# Patient Record
Sex: Female | Born: 1935 | Race: White | Hispanic: No | Marital: Married | Smoking: Never smoker
Health system: Southern US, Community
[De-identification: ages and names within clinical notes are randomized; demographics above are authoritative.]

## PROBLEM LIST (undated history)

## (undated) DIAGNOSIS — F419 Anxiety disorder, unspecified: Secondary | ICD-10-CM

## (undated) DIAGNOSIS — IMO0001 Reserved for inherently not codable concepts without codable children: Secondary | ICD-10-CM

## (undated) DIAGNOSIS — G43909 Migraine, unspecified, not intractable, without status migrainosus: Secondary | ICD-10-CM

## (undated) DIAGNOSIS — E785 Hyperlipidemia, unspecified: Secondary | ICD-10-CM

## (undated) HISTORY — DX: Hyperlipidemia, unspecified: E78.5

## (undated) HISTORY — DX: Reserved for inherently not codable concepts without codable children: IMO0001

## (undated) HISTORY — DX: Anxiety disorder, unspecified: F41.9

## (undated) HISTORY — PX: ABDOMINAL HYSTERECTOMY: SHX81

## (undated) HISTORY — DX: Migraine, unspecified, not intractable, without status migrainosus: G43.909

## (undated) HISTORY — PX: OOPHORECTOMY: SHX86

---

## 2002-08-23 HISTORY — PX: FLEXIBLE SIGMOIDOSCOPY: SHX1649

## 2003-05-24 ENCOUNTER — Other Ambulatory Visit: Admission: RE | Admit: 2003-05-24 | Discharge: 2003-05-24 | Payer: Self-pay | Admitting: Obstetrics and Gynecology

## 2005-05-25 ENCOUNTER — Encounter: Payer: Self-pay | Admitting: Family Medicine

## 2005-05-25 LAB — CONVERTED CEMR LAB

## 2005-06-20 ENCOUNTER — Other Ambulatory Visit: Admission: RE | Admit: 2005-06-20 | Discharge: 2005-06-20 | Payer: Self-pay | Admitting: Obstetrics and Gynecology

## 2006-05-01 ENCOUNTER — Ambulatory Visit: Payer: Self-pay | Admitting: Family Medicine

## 2006-05-17 DIAGNOSIS — F411 Generalized anxiety disorder: Secondary | ICD-10-CM | POA: Insufficient documentation

## 2006-05-17 DIAGNOSIS — M810 Age-related osteoporosis without current pathological fracture: Secondary | ICD-10-CM | POA: Insufficient documentation

## 2006-06-06 ENCOUNTER — Encounter: Payer: Self-pay | Admitting: Family Medicine

## 2006-07-04 ENCOUNTER — Ambulatory Visit: Payer: Self-pay | Admitting: Family Medicine

## 2006-07-04 DIAGNOSIS — R030 Elevated blood-pressure reading, without diagnosis of hypertension: Secondary | ICD-10-CM

## 2006-09-16 ENCOUNTER — Encounter: Payer: Self-pay | Admitting: Family Medicine

## 2006-09-16 LAB — CONVERTED CEMR LAB
Cholesterol: 271 mg/dL — ABNORMAL HIGH (ref 0–200)
Hgb A1c MFr Bld: 5.8 % (ref 4.6–6.1)
TSH: 3.889 microintl units/mL (ref 0.350–5.50)

## 2006-09-18 ENCOUNTER — Encounter: Payer: Self-pay | Admitting: Family Medicine

## 2006-09-26 ENCOUNTER — Ambulatory Visit: Payer: Self-pay | Admitting: Family Medicine

## 2006-12-25 ENCOUNTER — Ambulatory Visit: Payer: Self-pay | Admitting: Family Medicine

## 2007-03-27 ENCOUNTER — Ambulatory Visit: Payer: Self-pay | Admitting: Family Medicine

## 2007-03-27 DIAGNOSIS — E785 Hyperlipidemia, unspecified: Secondary | ICD-10-CM | POA: Insufficient documentation

## 2007-03-27 LAB — CONVERTED CEMR LAB
Basophils Absolute: 0 10*3/uL (ref 0.0–0.1)
Cholesterol: 295 mg/dL — ABNORMAL HIGH (ref 0–200)
Eosinophils Relative: 3 % (ref 0–5)
HCT: 42.2 % (ref 36.0–46.0)
HDL: 114 mg/dL (ref >39)
Hemoglobin: 14 g/dL (ref 12.0–15.0)
Lymphocytes Relative: 33 % (ref 12–46)
Lymphs Abs: 2.5 10*3/uL (ref 0.7–3.3)
Monocytes Absolute: 0.9 10*3/uL — ABNORMAL HIGH (ref 0.2–0.7)
Neutro Abs: 4 10*3/uL (ref 1.7–7.7)
Platelets: 375 10*3/uL (ref 150–400)
RDW: 13.4 % (ref 11.5–14.0)
Total CHOL/HDL Ratio: 2.6
VLDL: 22 mg/dL (ref 0–40)
WBC: 7.6 10*3/uL (ref 4.0–10.5)

## 2007-03-28 ENCOUNTER — Encounter: Payer: Self-pay | Admitting: Family Medicine

## 2007-05-05 ENCOUNTER — Telehealth: Payer: Self-pay | Admitting: Family Medicine

## 2007-05-26 ENCOUNTER — Ambulatory Visit: Payer: Self-pay | Admitting: Family Medicine

## 2007-05-26 ENCOUNTER — Telehealth: Payer: Self-pay | Admitting: Family Medicine

## 2007-06-18 ENCOUNTER — Ambulatory Visit: Payer: Self-pay | Admitting: Family Medicine

## 2007-08-13 ENCOUNTER — Encounter: Payer: Self-pay | Admitting: Family Medicine

## 2007-09-02 ENCOUNTER — Encounter: Payer: Self-pay | Admitting: Family Medicine

## 2007-09-18 ENCOUNTER — Telehealth: Payer: Self-pay | Admitting: Family Medicine

## 2007-09-19 ENCOUNTER — Encounter: Payer: Self-pay | Admitting: Family Medicine

## 2007-09-23 ENCOUNTER — Ambulatory Visit: Payer: Self-pay | Admitting: Family Medicine

## 2007-09-23 LAB — CONVERTED CEMR LAB
AST: 17 units/L (ref 0–37)
Alkaline Phosphatase: 65 units/L (ref 39–117)
BUN: 17 mg/dL (ref 6–23)
Glucose, Bld: 92 mg/dL (ref 70–99)
Potassium: 5.4 meq/L — ABNORMAL HIGH (ref 3.5–5.3)
Total Bilirubin: 0.7 mg/dL (ref 0.3–1.2)
Vit D, 1,25-Dihydroxy: 26 — ABNORMAL LOW (ref 30–89)

## 2007-09-30 ENCOUNTER — Encounter: Payer: Self-pay | Admitting: Family Medicine

## 2007-12-02 ENCOUNTER — Telehealth: Payer: Self-pay | Admitting: Family Medicine

## 2007-12-02 DIAGNOSIS — E875 Hyperkalemia: Secondary | ICD-10-CM | POA: Insufficient documentation

## 2007-12-03 ENCOUNTER — Encounter: Payer: Self-pay | Admitting: Family Medicine

## 2007-12-05 LAB — CONVERTED CEMR LAB
Potassium: 4.8 meq/L (ref 3.5–5.3)
Vit D, 1,25-Dihydroxy: 46 (ref 30–89)

## 2008-03-23 ENCOUNTER — Telehealth: Payer: Self-pay | Admitting: Family Medicine

## 2008-03-23 LAB — HM DEXA SCAN

## 2008-03-30 ENCOUNTER — Encounter: Admission: RE | Admit: 2008-03-30 | Discharge: 2008-03-30 | Payer: Self-pay | Admitting: Family Medicine

## 2008-03-30 ENCOUNTER — Encounter: Payer: Self-pay | Admitting: Family Medicine

## 2008-03-31 LAB — CONVERTED CEMR LAB
Cholesterol: 281 mg/dL — ABNORMAL HIGH (ref 0–200)
HDL: 103 mg/dL (ref >39)
Triglycerides: 165 mg/dL — ABNORMAL HIGH (ref <150)

## 2008-04-20 ENCOUNTER — Ambulatory Visit: Payer: Self-pay | Admitting: Family Medicine

## 2008-04-22 ENCOUNTER — Encounter: Payer: Self-pay | Admitting: Family Medicine

## 2008-04-23 LAB — CONVERTED CEMR LAB
BUN: 21 mg/dL (ref 6–23)
CO2: 21 meq/L (ref 19–32)
Chloride: 101 meq/L (ref 96–112)
Creatinine, Ser: 0.94 mg/dL (ref 0.40–1.20)
Glucose, Bld: 101 mg/dL — ABNORMAL HIGH (ref 70–99)
Potassium: 5.1 meq/L (ref 3.5–5.3)

## 2008-04-30 ENCOUNTER — Ambulatory Visit: Payer: Self-pay | Admitting: Family Medicine

## 2008-04-30 LAB — CONVERTED CEMR LAB: OCCULT 2: NEGATIVE

## 2008-05-31 ENCOUNTER — Telehealth: Payer: Self-pay | Admitting: Family Medicine

## 2008-08-20 ENCOUNTER — Encounter: Payer: Self-pay | Admitting: Family Medicine

## 2008-09-16 ENCOUNTER — Ambulatory Visit: Payer: Self-pay | Admitting: Family Medicine

## 2008-12-01 ENCOUNTER — Telehealth (INDEPENDENT_AMBULATORY_CARE_PROVIDER_SITE_OTHER): Payer: Self-pay | Admitting: *Deleted

## 2009-03-29 ENCOUNTER — Telehealth: Payer: Self-pay | Admitting: Family Medicine

## 2009-04-01 ENCOUNTER — Encounter: Payer: Self-pay | Admitting: Family Medicine

## 2009-04-04 LAB — CONVERTED CEMR LAB
AST: 21 units/L (ref 0–37)
Albumin: 4.5 g/dL (ref 3.5–5.2)
BUN: 18 mg/dL (ref 6–23)
CO2: 23 meq/L (ref 19–32)
Calcium: 9.5 mg/dL (ref 8.4–10.5)
Chloride: 102 meq/L (ref 96–112)
Cholesterol: 303 mg/dL — ABNORMAL HIGH (ref 0–200)
Glucose, Bld: 96 mg/dL (ref 70–99)
HDL: 107 mg/dL (ref >39)
Potassium: 5.5 meq/L — ABNORMAL HIGH (ref 3.5–5.3)
Vit D, 25-Hydroxy: 44 ng/mL (ref 30–89)

## 2009-04-18 ENCOUNTER — Ambulatory Visit: Payer: Self-pay | Admitting: Family Medicine

## 2009-04-20 LAB — CONVERTED CEMR LAB: Potassium: 4.3 meq/L (ref 3.5–5.3)

## 2009-08-27 ENCOUNTER — Encounter: Payer: Self-pay | Admitting: Family Medicine

## 2009-10-11 ENCOUNTER — Telehealth: Payer: Self-pay | Admitting: Family Medicine

## 2009-10-11 DIAGNOSIS — R5381 Other malaise: Secondary | ICD-10-CM

## 2009-10-11 DIAGNOSIS — R5383 Other fatigue: Secondary | ICD-10-CM

## 2009-10-12 ENCOUNTER — Encounter: Payer: Self-pay | Admitting: Family Medicine

## 2009-10-13 LAB — CONVERTED CEMR LAB
Hemoglobin: 13.5 g/dL (ref 12.0–15.0)
Lymphs Abs: 2.8 10*3/uL (ref 0.7–4.0)
MCV: 93.5 fL (ref 78.0–100.0)
Monocytes Relative: 10 % (ref 3–12)
Neutro Abs: 4.1 10*3/uL (ref 1.7–7.7)
Neutrophils Relative %: 51 % (ref 43–77)
Potassium: 5.4 meq/L — ABNORMAL HIGH (ref 3.5–5.3)
RBC: 4.34 M/uL (ref 3.87–5.11)
Vit D, 25-Hydroxy: 47 ng/mL (ref 30–89)
Vitamin B-12: 746 pg/mL (ref 211–911)
WBC: 8.1 10*3/uL (ref 4.0–10.5)

## 2009-10-17 ENCOUNTER — Ambulatory Visit: Payer: Self-pay | Admitting: Family Medicine

## 2010-02-01 ENCOUNTER — Encounter: Payer: Self-pay | Admitting: Family Medicine

## 2010-02-02 LAB — CONVERTED CEMR LAB
BUN: 20 mg/dL (ref 6–23)
Calcium: 9.8 mg/dL (ref 8.4–10.5)
Creatinine, Ser: 0.9 mg/dL (ref 0.40–1.20)
Glucose, Bld: 88 mg/dL (ref 70–99)

## 2010-04-19 ENCOUNTER — Telehealth: Payer: Self-pay | Admitting: Family Medicine

## 2010-04-21 ENCOUNTER — Encounter: Payer: Self-pay | Admitting: Family Medicine

## 2010-04-21 LAB — CONVERTED CEMR LAB
ALT: 16 units/L (ref 0–35)
Alkaline Phosphatase: 63 units/L (ref 39–117)
LDL Cholesterol: 164 mg/dL — ABNORMAL HIGH (ref 0–99)
Sodium: 140 meq/L (ref 135–145)
Total Bilirubin: 0.8 mg/dL (ref 0.3–1.2)
Total Protein: 6.8 g/dL (ref 6.0–8.3)
Triglycerides: 107 mg/dL (ref <150)
VLDL: 21 mg/dL (ref 0–40)

## 2010-04-25 ENCOUNTER — Ambulatory Visit: Payer: Self-pay | Admitting: Family Medicine

## 2010-05-10 ENCOUNTER — Encounter: Admission: RE | Admit: 2010-05-10 | Discharge: 2010-05-10 | Payer: Self-pay | Admitting: Family Medicine

## 2010-06-20 ENCOUNTER — Ambulatory Visit: Payer: Self-pay | Admitting: Obstetrics & Gynecology

## 2010-08-22 NOTE — Miscellaneous (Signed)
Summary: mammogram normal  Clinical Lists Changes  Observations: Added new observation of MAMMO DUE: 08/25/2010 (08/27/2009 12:26) Added new observation of FLUVAXDUE: 04/18/2010 (08/27/2009 12:26) Added new observation of TDBOOSTDUE: 02/21/2012 (08/27/2009 12:26) Added new observation of PNEUMVXNEXT: None (08/27/2009 12:26) Added new observation of HDLNXTDUE: 04/01/2014 (08/27/2009 12:26) Added new observation of LDLNXTDUE: 04/01/2014 (08/27/2009 12:26) Added new observation of PAP DUE: 05/25/2006 (08/27/2009 12:26) Added new observation of HGBA1CNXTDUE: 12/15/2006 (08/27/2009 12:26) Added new observation of CREATNXTDUE: 04/01/2010 (08/27/2009 12:26) Added new observation of POTASSIUMDUE: 04/18/2010 (08/27/2009 12:26) Added new observation of LAST MAM DAT: 08/25/2009 (08/25/2009 12:27) Added new observation of MAMMOGRAM: normal (08/25/2009 12:27)     Last Mammogram:  Done at [facility]  No specific mammographic evidence of malignancy.  No significant changes compared to previous study.  Assessment: BIRADS 1.  (08/19/2008 2:08:45 PM) Mammogram Result Date:  08/25/2009 Mammogram Result:  normal Mammogram Next Due:  1 yr

## 2010-08-22 NOTE — Assessment & Plan Note (Signed)
Summary: f/u anxiety   Vital Signs:  Patient profile:   75 year old female Height:      63.25 inches Weight:      146 pounds BMI:     25.75 O2 Sat:      98 % on Room air Pulse rate:   65 / minute BP sitting:   189 / 87  (left arm) Cuff size:   regular  Vitals Entered By: Payton Spark CMA (October 17, 2009 8:23 AM)  O2 Flow:  Room air CC: F/U labs   Primary Care Provider:  Seymour Bars D.O.  CC:  F/U labs.  History of Present Illness: 75 yo WF presents for f/u visit.  She has white coat HTN.  Her home BP readings are all <140/90.  She is under a lot of stress at work.  Her labs just showed a mildly high K+ at 5.4.  She denies using a salt substitute.  She has had normal kidney function.  Her B12 and Vitamin D came back normal.  Her blood counts are normal.    Her TSH was elevated at 5.1.  Added on a free T3 and Free T4 today.      Current Medications (verified): 1)  Fish Oil 1000 Mg Caps (Omega-3 Fatty Acids) .Marland Kitchen.. 1 Capusule Tid 2)  Ativan 1 Mg Tabs (Lorazepam) .... 1/2 Tab By Mouth Two Times A Day As Needed Anxiety 3)  Caltrate 600 1500 Mg Tabs (Calcium Carbonate) .Marland Kitchen.. 1 Tab By Mouth Two Times A Day Ac 4)  Multivitamins  Caps (Multiple Vitamin) .Marland Kitchen.. 1 Tab By Mouth Daily 5)  Vitamin D 50,000 Iu .Marland Kitchen.. 1 Capsule By Mouth Q Week  Allergies (verified): 1)  ! Sulfa 2)  ! Codeine 3)  ! Epinephrine 4)  ! Prednisone  Past History:  Past Medical History: Reviewed history from 04/18/2009 and no changes required. Hemorrhoids Migraines Anxiety Osteoporosis White Coat HTN dyslipidemia  Past Surgical History: Reviewed history from 09/16/2008 and no changes required. Hysterectomy Oophorectomy flex sig- 2/04 negative DXA T score -3.0 9-09  Family History: Reviewed history from 05/17/2006 and no changes required. father died of COPD at 53 mother died of CHF, DM at 48 4 brothers, 1 sister, 1 daughter healthy 1 brother CVA at 85 1 brother- CLL leukemia  Social  History: Reviewed history from 09/16/2008 and no changes required. Married x 6 yrs.  Retired from [employer]	[place], but still working.  Nonsmoker.  Exercises regularly.  works at [employer].  Review of Systems      See HPI  Physical Exam  General:  alert, well-developed, well-nourished, and well-hydrated.   Head:  normocephalic and atraumatic.   Eyes:  pupils equal, pupils round, and pupils reactive to light.   Mouth:  pharynx pink and moist.   Neck:  no masses.   Lungs:  Normal respiratory effort, chest expands symmetrically. Lungs are clear to auscultation, no crackles or wheezes. Heart:  Normal rate and regular rhythm. S1 and S2 normal without gallop, murmur, click, rub or other extra sounds. Extremities:  no LE edema Skin:  color normal.   Cervical Nodes:  No lymphadenopathy noted Psych:  good eye contact and slightly anxious.     Impression & Recommendations:  Problem # 1:  ANXIETY (ICD-300.00) Stable with as needed use of Ativan.  We discussed her work stressors.  She is thinking about retirement and then starting to volunteer which would make her happier.  Continue medication and regular exercise which also  helps.   Her updated medication list for this problem includes:    Ativan 1 Mg Tabs (Lorazepam) .Marland Kitchen... 1/2 tab by mouth two times a day as needed anxiety  Problem # 2:  HYPERTENSION, WHITE COAT (ICD-796.2) Home BP readings look great.  We checked her home bP cuff last year against our and it was accurate.    Problem # 3:  HYPERKALEMIA (ICD-276.7) K+ 5.4 just mildly high w/o use of a salt substitute  and no hemolysis. Recheck in 4 mos.  Likely has RTA -- if increasing, will get an EKG and nephrology eval. Orders: T-Basic Metabolic Panel [id number])  Problem # 4:  DYSLIPIDEMIA (ICD-272.4) Not on meds.  HDL great and CRP was low in the Fall.  REcheck at CPE this fall. Labs Reviewed: SGOT: 21 (04/01/2009)   SGPT: 21 (04/01/2009)   HDL:107 (04/01/2009), 103  (03/30/2008)  LDL:166 (04/01/2009), 145 (03/30/2008)  Chol:303 (04/01/2009), 281 (03/30/2008)  Trig:150 (04/01/2009), 165 (03/30/2008)  Complete Medication List: 1)  Fish Oil 1000 Mg Caps (Omega-3 fatty acids) .Marland Kitchen.. 1 capusule tid 2)  Ativan 1 Mg Tabs (Lorazepam) .... 1/2 tab by mouth two times a day as needed anxiety 3)  Caltrate 600 1500 Mg Tabs (Calcium carbonate) .Marland Kitchen.. 1 tab by mouth two times a day ac 4)  Multivitamins Caps (Multiple vitamin) .Marland Kitchen.. 1 tab by mouth daily 5)  Vitamin D 50,000 Iu  .Marland Kitchen.. 1 capsule by mouth q week  Patient Instructions: 1)  Stay on current meds. 2)  REcheck K+ in 4 mos.

## 2010-08-22 NOTE — Assessment & Plan Note (Signed)
Summary: Medicare PHYS   Vital Signs:  Patient profile:   75 year old female Height:      63.25 inches Weight:      144 pounds BMI:     25.40 O2 Sat:      98 % on Room air Pulse rate:   65 / minute BP sitting:   178 / 85  (left arm) Cuff size:   regular  Vitals Entered By: Payton Spark CMA (April 25, 2010 8:15 AM)  O2 Flow:  Room air CC: Medicare Wellness Exam   Primary Care Provider:  Seymour Bars D.O.  CC:  Medicare Wellness Exam.  History of Present Illness: 75 yo WF presents for Medicare AWV.  See scanned in form for details.    Current Medications (verified): 1)  Fish Oil 1000 Mg Caps (Omega-3 Fatty Acids) .Marland Kitchen.. 1 Capusule Tid 2)  Ativan 1 Mg Tabs (Lorazepam) .... 1/2 Tab By Mouth Two Times A Day As Needed Anxiety 3)  Caltrate 600 1500 Mg Tabs (Calcium Carbonate) .Marland Kitchen.. 1 Tab By Mouth Two Times A Day Ac 4)  Multivitamins  Caps (Multiple Vitamin) .Marland Kitchen.. 1 Tab By Mouth Daily 5)  Vitamin D 50,000 Iu .Marland Kitchen.. 1 Capsule By Mouth Q Week  Allergies (verified): 1)  ! Sulfa 2)  ! Codeine 3)  ! Epinephrine 4)  ! Prednisone  Past History:  Past Medical History: Reviewed history from 04/18/2009 and no changes required. Hemorrhoids Migraines Anxiety Osteoporosis White Coat HTN dyslipidemia  Past Surgical History: Reviewed history from 09/16/2008 and no changes required. Hysterectomy Oophorectomy flex sig- 2/04 negative DXA T score -3.0 9-09  Family History: Reviewed history from 05/17/2006 and no changes required. father died of COPD at 87 mother died of CHF, DM at 40 4 brothers, 1 sister, 1 daughter healthy 1 brother CVA at 61 1 brother- CLL leukemia  Social History: Reviewed history from 09/16/2008 and no changes required. Married x 6 yrs.  Retired from [employer]	[place], but still working.  Nonsmoker.  Exercises regularly.  works at [employer].  Review of Systems  The patient denies anorexia, fever, weight loss, weight gain, vision loss,  decreased hearing, hoarseness, chest pain, syncope, dyspnea on exertion, peripheral edema, prolonged cough, headaches, hemoptysis, abdominal pain, melena, hematochezia, severe indigestion/heartburn, hematuria, incontinence, genital sores, muscle weakness, suspicious skin lesions, transient blindness, difficulty walking, depression, unusual weight change, abnormal bleeding, enlarged lymph nodes, angioedema, breast masses, and testicular masses.    Physical Exam  General:  alert, well-developed, well-nourished, and well-hydrated.   Head:  normocephalic and atraumatic.   Eyes:  pupils equal, pupils round, and pupils reactive to light.   Ears:  EACs patent; TMs translucent and gray with good cone of light and bony landmarks.  Nose:  no nasal discharge.   Mouth:  pharynx pink and moist and fair dentition.   Neck:  no masses.  no aubidle carotid bruits Lungs:  Normal respiratory effort, chest expands symmetrically. Lungs are clear to auscultation, no crackles or wheezes. Heart:  Normal rate and regular rhythm. S1 and S2 normal without gallop, murmur, click, rub or other extra sounds. Abdomen:  Bowel sounds positive,abdomen soft and non-tender without masses, organomegaly or hernias noted. Pulses:  2+ radial and pedal pulses Extremities:  no LE edema Skin:  color normal and no suspicious lesions.   Cervical Nodes:  No lymphadenopathy noted Psych:  good eye contact, not anxious appearing, and not depressed appearing.     Impression & Recommendations:  Problem # 1:  HEALTH MAINTENANCE EXAM (ICD-V70.0) Personalized Plan for Preventive Services Reviewed with pt and copy given to her today. Will update Flu shot andZostavax today.  Labs are UTD.  Colonoscopy and mammogram are UTD. Eye exam is UTD.  EKG done today - NSR w/o any sign of arrythmia or ischemia. DEXA due -- ordered.  BP at home is at goal, high here. BMI at goal.  Does not reqire pap/ pelvic unless having vag bleeding, discarge or skin  lesions. RTC for f/u in 6 mos. Orders: [facility] -Subsequent Annual Wellness Visit [phone])  Complete Medication List: 1)  Fish Oil 1000 Mg Caps (Omega-3 fatty acids) .Marland Kitchen.. 1 capusule tid 2)  Ativan 1 Mg Tabs (Lorazepam) .... 1/2 tab by mouth two times a day as needed anxiety 3)  Caltrate 600 1500 Mg Tabs (Calcium carbonate) .Marland Kitchen.. 1 tab by mouth two times a day ac 4)  Multivitamins Caps (Multiple vitamin) .Marland Kitchen.. 1 tab by mouth daily 5)  Vitamin D 50,000 Iu  .Marland Kitchen.. 1 capsule by mouth q week  Other Orders: T-DXA Bone Density/ Appendicular (60454) T-Dual DXA Bone Density/ Axial (09811) Flu Vaccine 54yrs + MEDICARE PATIENTS (B1478) Administration Flu vaccine - MCR (G0008) Zoster (Shingles) Vaccine Live (29562) Admin 1st Vaccine (13086) Admin 1st Vaccine (State) (57846N) EKG w/ Interpretation (93000) Flu Vaccine Consent Questions     Do you have a history of severe allergic reactions to this vaccine? no    Any prior history of allergic reactions to egg and/or gelatin? no    Do you have a sensitivity to the preservative Thimersol? no    Do you have a past history of Guillan-Barre Syndrome? no    Do you currently have an acute febrile illness? no    Have you ever had a severe reaction to latex? no    Vaccine information given and explained to patient? yes    Are you currently pregnant? no    Lot Number:[id number]   Exp Date:01/20/2011   Site Given  Left Deltoid IM-CCC]   .lbmedflu   Zostavax # 1    Vaccine Type: Zostavax    Site: left deltoid    Dose: 0.5 ml    Route: IM    Given by: Payton Spark CMA    Exp. Date: 02/24/2011    Lot #: [id number]    VIS given: 05/04/05 given April 25, 2010.  Appended Document: Medicare PHYS     Vitals Entered By: Payton Spark CMA (May 09, 2010 8:18 AM)  Allergies: 1)  ! Sulfa 2)  ! Codeine 3)  ! Epinephrine 4)  ! Prednisone   Complete Medication List: 1)  Fish Oil 1000 Mg Caps (Omega-3 fatty acids) .Marland Kitchen.. 1 capusule tid 2)  Ativan 1 Mg Tabs  (Lorazepam) .... 1/2 tab by mouth two times a day as needed anxiety 3)  Caltrate 600 1500 Mg Tabs (Calcium carbonate) .Marland Kitchen.. 1 tab by mouth two times a day ac 4)  Multivitamins Caps (Multiple vitamin) .Marland Kitchen.. 1 tab by mouth daily 5)  Vitamin D 50,000 Iu  .Marland Kitchen.. 1 capsule by mouth q week      Laboratory Results    Stool - Occult Blood Hemmoccult #1: negative Hemoccult #2: negative Hemoccult #3: negative

## 2010-08-22 NOTE — Progress Notes (Signed)
Summary: Labs  Phone Note Call from Patient Call back at Home Phone [phone]   Caller: Patient Call For: Seymour Bars DO Summary of Call: Pt has CPE scheduled for 10/4 and wants you to fax her labs down  because she wants to go tomorrow to have them done Initial call taken by: Kathlene November LPN,  April 19, 2010 9:42 AM  Follow-up for Phone Call        i printed order for fasting labs. Follow-up by: Seymour Bars DO,  April 19, 2010 10:02 AM  New Problems: OTH&UNSPEC ENDOCRN NUTRIT METAB&IMMUNITY D/O (ICD-V77.99) SCREENING FOR LIPOID DISORDERS (ICD-V77.91)   New Problems: OTH&UNSPEC ENDOCRN NUTRIT METAB&IMMUNITY D/O (ICD-V77.99) SCREENING FOR LIPOID DISORDERS (ICD-V77.91)  Appended Document: Labs Labs faxed

## 2010-08-22 NOTE — Progress Notes (Signed)
Summary: Lab order  Phone Note Call from Patient   Caller: Patient Summary of Call: Pt has apt scheduled for 10/17/09. She thinks it is time for labs and would like to know if she can have them done this week so you will have the results back for her apt. Pt also states she has been extremely tired and would like labs done for fatigue. Please advise. Initial call taken by: Payton Spark CMA,  October 11, 2009 9:31 AM  Follow-up for Phone Call        pt had cholesterol/CRP in Sept 2010 -- does not need until Sept this year. Will get other labs though for fatigue, potassium.   Follow-up by: Seymour Bars DO,  October 11, 2009 9:48 AM  New Problems: FATIGUE (ICD-780.79)   New Problems: FATIGUE (ICD-780.79)  Appended Document: Lab order Pt aware

## 2010-09-03 ENCOUNTER — Encounter: Payer: Self-pay | Admitting: Family Medicine

## 2010-09-11 ENCOUNTER — Encounter: Payer: Self-pay | Admitting: Family Medicine

## 2010-09-13 NOTE — Miscellaneous (Signed)
Summary: mammogram normal  Clinical Lists Changes  Observations: Added new observation of MAMMO DUE: 08/2011 (09/03/2010 11:02) Added new observation of MAMMRECACT: Screening mammogram in 1 year.    (08/31/2010 11:03) Added new observation of MAMMOGRAM: Location: [facility].  No specific mammographic evidence of malignancy.  No significant changes compared to previous study.  Assessment: BIRADS 2.  (08/31/2010 11:03)      Mammogram  Procedure date:  08/31/2010  Findings:      Location: [facility].  No specific mammographic evidence of malignancy.  No significant changes compared to previous study.  Assessment: BIRADS 2.   Comments:      Screening mammogram in 1 year.     Procedures Next Due Date:    Mammogram: 08/2011   Mammogram  Procedure date:  08/31/2010  Findings:      Location: [facility].  No specific mammographic evidence of malignancy.  No significant changes compared to previous study.  Assessment: BIRADS 2.   Comments:      Screening mammogram in 1 year.     Procedures Next Due Date:    Mammogram: 08/2011

## 2010-10-17 ENCOUNTER — Telehealth: Payer: Self-pay | Admitting: *Deleted

## 2010-10-17 DIAGNOSIS — Z13220 Encounter for screening for lipoid disorders: Secondary | ICD-10-CM

## 2010-10-17 DIAGNOSIS — Z1329 Encounter for screening for other suspected endocrine disorder: Secondary | ICD-10-CM

## 2010-10-17 NOTE — Telephone Encounter (Signed)
Pt states she would like to have labs done prior to CPE next week. Please order and I will fax.

## 2010-10-18 NOTE — Telephone Encounter (Signed)
LMOM informing Pt that we are unable to fax due to issue w/ insurance info not printing on order. Will fax and call Pt when resolved.

## 2010-10-18 NOTE — Telephone Encounter (Signed)
Lab order printed but with our new computer system, she will have to show her insurance card downtairs.  She can pick up the lab order up here.

## 2010-10-20 ENCOUNTER — Encounter: Payer: Self-pay | Admitting: Family Medicine

## 2010-10-25 ENCOUNTER — Encounter: Payer: Self-pay | Admitting: Family Medicine

## 2010-10-25 ENCOUNTER — Ambulatory Visit (INDEPENDENT_AMBULATORY_CARE_PROVIDER_SITE_OTHER): Payer: BC Managed Care – PPO | Admitting: Family Medicine

## 2010-10-25 DIAGNOSIS — E785 Hyperlipidemia, unspecified: Secondary | ICD-10-CM

## 2010-10-25 DIAGNOSIS — I10 Essential (primary) hypertension: Secondary | ICD-10-CM

## 2010-10-25 DIAGNOSIS — F411 Generalized anxiety disorder: Secondary | ICD-10-CM

## 2010-10-25 DIAGNOSIS — R030 Elevated blood-pressure reading, without diagnosis of hypertension: Secondary | ICD-10-CM

## 2010-10-25 DIAGNOSIS — M810 Age-related osteoporosis without current pathological fracture: Secondary | ICD-10-CM

## 2010-10-25 MED ORDER — LORAZEPAM 1 MG PO TABS: 0.5000 mg | ORAL_TABLET | Freq: Two times a day (BID) | ORAL | Status: DC | PRN

## 2010-10-25 NOTE — Patient Instructions (Signed)
Keep up the good work with healthy diet, regular exercise, MVI + Calcium with D daily.  Update labs downstairs today. Will call you w/ results tomorrow.  Return for a MEDICARE WELLNESS VISIT  The end of October.

## 2010-10-25 NOTE — Assessment & Plan Note (Signed)
Situationally related to work and only needing prn ativan.  She plan to retire next month, so hopefully she will not need to continue the ativan.  Her husband is supportive.

## 2010-10-25 NOTE — Assessment & Plan Note (Signed)
She brought in her home BP readings today and they are all <140/90.  Will continue to follow.

## 2010-10-25 NOTE — Progress Notes (Signed)
  Subjective:    Patient ID: Kimberly Durham, female    DOB: [birth year], 75 y.o.   MRN: [id number]  HPI 75 yo WF presents for f/u visit.  She is checking her BPs at home.  Her home readings are <140/90.  Her brother passed away last week after having a stroke.  She has a good support system.  She plans on retiring from her stressful job in the next month.  Sht takes Ativan for job related anxiety.  Denies CP, DOE, palpitations, edema.  Doing well on Fish oil, eating healthy and exercising.  BP 155/90  Pulse 68  Ht 5\' 4"  (1.626 m)  Wt 147 lb (66.679 kg)  BMI 25.23 kg/m2  SpO2 99%    Review of Systems as per HPI     Objective:   Physical Exam  Constitutional: She appears well-developed and well-nourished. No distress.  Eyes: Pupils are equal, round, and reactive to light.  Neck: No thyromegaly present.  Cardiovascular: Normal rate, regular rhythm and normal heart sounds.   No murmur heard. Pulmonary/Chest: Effort normal and breath sounds normal. No respiratory distress.  Musculoskeletal: She exhibits no edema.  Lymphadenopathy:    She has no cervical adenopathy.  Skin: Skin is warm and dry.  Psychiatric: She has a normal mood and affect.          Assessment & Plan:

## 2010-10-25 NOTE — Assessment & Plan Note (Signed)
Doing well on meds. Pt wishes to continue labs q 6 mos to check.  She is also eating healthy and exercising.  RTC for a physical in 6 mos.

## 2010-10-26 ENCOUNTER — Telehealth: Payer: Self-pay | Admitting: Family Medicine

## 2010-10-26 LAB — LIPID PANEL
Cholesterol: 298 mg/dL — ABNORMAL HIGH (ref 0–200)
HDL: 114 mg/dL (ref >39)
Total CHOL/HDL Ratio: 2.6 Ratio
Triglycerides: 153 mg/dL — ABNORMAL HIGH (ref <150)
VLDL: 31 mg/dL (ref 0–40)

## 2010-10-26 LAB — CBC
MCV: 95.2 fL (ref 78.0–100.0)
Platelets: 338 10*3/uL (ref 150–400)
RDW: 13.9 % (ref 11.5–15.5)
WBC: 6.5 10*3/uL (ref 4.0–10.5)

## 2010-10-26 LAB — COMPLETE METABOLIC PANEL WITH GFR
ALT: 18 U/L (ref 0–35)
AST: 19 U/L (ref 0–37)
Calcium: 10 mg/dL (ref 8.4–10.5)
Chloride: 100 mEq/L (ref 96–112)
Creat: 1.04 mg/dL (ref 0.40–1.20)

## 2010-10-26 NOTE — Telephone Encounter (Signed)
Pls let pt know that her blood counts came back normal.  Her fasting sugar is elevated in the pre - diabetic range at 112 with fair kidney function, normal liver function.  Vit D is adequate, so she does NOT need RX vit D -- can take OTC Vit D 1,000 IU/ day.  Cholesterol OK with very high protective HDL good cholesterol.  Repeat sugar in 6 mos - work on low sugar diet, regular exercise.

## 2010-10-26 NOTE — Telephone Encounter (Signed)
Pt aware of the above  

## 2010-12-05 NOTE — Assessment & Plan Note (Signed)
NAMEGENNIFER, POTENZA NO.:  [id number]   MEDICAL RECORD NO.:  [id number]          PATIENT TYPE:  POB   LOCATION:  [facility] [place]         FACILITY:  [facility]   PHYSICIAN:  Allie Bossier, MD        DATE OF BIRTH:  1936-03-04   DATE OF SERVICE:  06/20/2010                                  CLINIC NOTE   Ms. Covey is a 75 year old married white G4, P1, A3.  She has a 21-  year-old daughter and no grand kids.  She comes in here for a new  patient annual exam.  She has no GYN complaints.  She does tell me that  she was diagnosed with osteopenia/osteoporosis for several years and her  bone density was done this year.   PAST MEDICAL HISTORY:  Migraines, osteopenia/osteoporosis.   MEDICATIONS:  Ativan 0.5 mg p.r.n., multivitamin, calcium with D, and  fish oil.   REVIEW OF SYSTEMS:  She is currently married to her third husband.  She  had been married for 10 years.  They are sexually active and she uses a  lubricant.  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  Mammogram is up-to-date.  Her guaiac and colonoscopy/flex sig are up-to-  date.   PAST SURGICAL HISTORY:  TAH-LSO and appendectomy for dysfunctional  uterine bleeding, D and C times at least for.   FAMILY HISTORY:  Negative for breast, GYN, and colon malignancies but  positive for coronary artery disease and diabetes.   ALLERGIES:  No latex allergies.  Drug allergies are CODEINE and  EPINEPHRINE.   PHYSICAL EXAMINATION:  GENERAL:  Well-nourished, well-hydrated, very  pleasant white female.  VITAL SIGNS:  Height 5 feet 4 inches, weight 147 pounds, blood pressure  is 204/91.  Please note, she has white coat syndrome and has brought in  a multitude of normal blood pressures and pulses that she has checked  over the last month.  Pulse is 67.  HEENT:   Normal.  HEART:  Regular rate and rhythm.  LUNGS:  Clear to auscultation bilaterally.  BREASTS:  Normal bilaterally.  No adenopathy.  ABDOMEN:  Benign.  No palpable hepatosplenomegaly.  EXTERNAL GENITALIA:  Marked atrophy.  No lesions.  Vagina marked  atrophy, no lesions.  Bimanual exam, no masses palpated throughout.   ASSESSMENT AND PLAN:  Annual exam.  Per ACOG recommendations, I have not  done a Pap smear on this hysterectomized 75 year old.  I have  recommended self-breast and self-vulvar exams and she knows to have her  mammogram done on an annual basis.  With regard to her  osteopenia/osteoporosis, I am not recommending another  bisphosphonate since she has had stomach difficulty with the generic  Fosamax.  I have recommended Evista and given her prescription for 60 mg  daily.  She will come back in 1 year or p.r.n. basis.      Allie Bossier, MD     MCD/MEDQ  D:  06/20/2010  T:  06/21/2010  Job:  [id number]

## 2010-12-14 ENCOUNTER — Other Ambulatory Visit: Payer: Self-pay | Admitting: Family Medicine

## 2011-01-16 ENCOUNTER — Other Ambulatory Visit: Payer: Self-pay | Admitting: Family Medicine

## 2011-02-14 ENCOUNTER — Other Ambulatory Visit: Payer: Self-pay | Admitting: Family Medicine

## 2011-03-14 ENCOUNTER — Other Ambulatory Visit: Payer: Self-pay | Admitting: Family Medicine

## 2011-04-13 ENCOUNTER — Other Ambulatory Visit: Payer: Self-pay | Admitting: *Deleted

## 2011-04-13 MED ORDER — LORAZEPAM 0.5 MG PO TABS: 0.5000 mg | ORAL_TABLET | Freq: Two times a day (BID) | ORAL | Status: DC | PRN

## 2011-05-09 ENCOUNTER — Telehealth: Payer: Self-pay | Admitting: Family Medicine

## 2011-05-09 DIAGNOSIS — E785 Hyperlipidemia, unspecified: Secondary | ICD-10-CM

## 2011-05-09 DIAGNOSIS — Z0000 Encounter for general adult medical examination without abnormal findings: Secondary | ICD-10-CM

## 2011-05-09 NOTE — Telephone Encounter (Signed)
Pt called and wants to do her labs over the next week prior to her CPE appt scheduled for 05-16-11.  Plan:  Ordered CMP, Fasting Chol panel.  Faxed to [facility].  Pt informed. Jarvis Newcomer, LPN Domingo Dimes

## 2011-05-11 LAB — COMPLETE METABOLIC PANEL WITH GFR
ALT: 21 U/L (ref 0–35)
CO2: 26 mEq/L (ref 19–32)
Calcium: 9.6 mg/dL (ref 8.4–10.5)
Chloride: 101 mEq/L (ref 96–112)
GFR, Est African American: 65 mL/min — ABNORMAL LOW (ref >90)
Potassium: 5.1 mEq/L (ref 3.5–5.3)
Sodium: 136 mEq/L (ref 135–145)
Total Protein: 7.3 g/dL (ref 6.0–8.3)

## 2011-05-11 LAB — LIPID PANEL: LDL Cholesterol: 159 mg/dL — ABNORMAL HIGH (ref 0–99)

## 2011-05-16 ENCOUNTER — Ambulatory Visit (INDEPENDENT_AMBULATORY_CARE_PROVIDER_SITE_OTHER): Payer: BC Managed Care – PPO | Admitting: Family Medicine

## 2011-05-16 ENCOUNTER — Telehealth: Payer: Self-pay | Admitting: Family Medicine

## 2011-05-16 ENCOUNTER — Encounter: Payer: Self-pay | Admitting: Family Medicine

## 2011-05-16 DIAGNOSIS — E785 Hyperlipidemia, unspecified: Secondary | ICD-10-CM

## 2011-05-16 DIAGNOSIS — Z0000 Encounter for general adult medical examination without abnormal findings: Secondary | ICD-10-CM

## 2011-05-16 MED ORDER — LORAZEPAM 1 MG PO TABS: 0.5000 mg | ORAL_TABLET | Freq: Two times a day (BID) | ORAL | Status: DC | PRN

## 2011-05-16 NOTE — Patient Instructions (Addendum)
Keep up your regular exercise program and make sure you are eating a healthy diet Try to eat 4 servings of dairy a day or take a calcium supplement (500mg  twice a day). Your vaccines are up to date.  Consider adding red yeast rice to diet to help lower your cholestrol.

## 2011-05-16 NOTE — Assessment & Plan Note (Signed)
We will hold off on starting a statin. I do encourage her to exercise regularly, lose weight and eat a low-fat diet to improve her LDL.

## 2011-05-16 NOTE — Progress Notes (Signed)
  Subjective:     Kimberly Durham is a 75 y.o. female and is here for a comprehensive physical exam. The patient reports no problems.  History   Social History  . Marital Status: Married    Spouse Name: Kimberly Durham    Number of Children: 1  . Years of Education: N/A   Occupational History  . works partime     [employer]   Social History Main Topics  . Smoking status: Never Smoker   . Smokeless tobacco: Not on file  . Alcohol Use: Yes  . Drug Use: No  . Sexually Active: Yes -- Female partner(s)   Other Topics Concern  . Not on file   Social History Narrative  . No narrative on file   Health Maintenance  Topic Date Due  . Tetanus/tdap  02/21/2012  . Influenza Vaccine  04/22/2012  . Colonoscopy  05/15/2021  . Pneumococcal Polysaccharide Vaccine Age 57 And Over  Completed  . Zostavax  Completed    The following portions of the patient's history were reviewed and updated as appropriate: allergies, current medications, past family history, past medical history, past social history and past surgical history.  Review of Systems A comprehensive review of systems was negative.   Objective:    BP 155/83  Pulse 73  Ht 5\' 4"  (1.626 m)  Wt 145 lb (65.772 kg)  BMI 24.89 kg/m2 General appearance: alert, cooperative and appears stated age Head: Normocephalic, without obvious abnormality, atraumatic Eyes: conjunctiva clear, EOMi, PEERLA Ears: normal TM's and external ear canals both ears Nose: Nares normal. Septum midline. Mucosa normal. No drainage or sinus tenderness. Throat: lips, mucosa, and tongue normal; teeth and gums normal Neck: no adenopathy, no carotid bruit, supple, symmetrical, trachea midline and thyroid not enlarged, symmetric, no tenderness/mass/nodules Back: symmetric, no curvature. ROM normal. No CVA tenderness. Lungs: clear to auscultation bilaterally Breasts: normal appearance, no masses or tenderness Heart: regular rate and rhythm, S1, S2 normal, no murmur, click, rub or  gallop Abdomen: soft, non-tender; bowel sounds normal; no masses,  no organomegaly Extremities: extremities normal, atraumatic, no cyanosis or edema Pulses: 2+ and symmetric Skin: Skin color, texture, turgor normal. No rashes or lesions Lymph nodes: Cervical, supraclavicular, and axillary nodes normal. Neurologic: Grossly normal    Assessment:    Healthy female exam.      Plan:     See After Visit Summary for Counseling Recommendations  Start a regular exercise program and make sure you are eating a healthy diet Try to eat 4 servings of dairy a day or take a calcium supplement (500mg  twice a day). Your vaccines are up to date.  Reviewed her chol resulst with her an given copy. Will call her after I plug her into the NCEP guidelines to determine her risk.

## 2011-05-16 NOTE — Telephone Encounter (Signed)
Call pt: Based on NCEP guidelines her 10 year risk is 3%. Her only major risk factor is her age. Based on this her LDL goal is under 160. She has fantastic HDL. We will hold off on starting a statin. I do encourage her to exercise regularly, lose weight and eat a low-fat diet to improve her LDL.

## 2011-05-16 NOTE — Telephone Encounter (Signed)
LMOM

## 2011-11-06 ENCOUNTER — Telehealth: Payer: Self-pay | Admitting: *Deleted

## 2011-11-06 DIAGNOSIS — Z0000 Encounter for general adult medical examination without abnormal findings: Secondary | ICD-10-CM

## 2011-11-06 NOTE — Telephone Encounter (Signed)
Okay to check vitamin D level. You can use diagnosis of osteoporosis.

## 2011-11-06 NOTE — Telephone Encounter (Signed)
Pt is wanting to come in Thursday to have her blood work done for her visit with you on the 24th. I informed her that I would be placing orders for lipid panel, cbc, and cmp. Pt states that she takes calcium daily and Vit D 400mg  every other day. She wants to know if you think she should also have her Vit D level checked. Please advise and I can place orders.

## 2011-11-06 NOTE — Telephone Encounter (Signed)
Labs ordered.

## 2011-11-09 LAB — COMPREHENSIVE METABOLIC PANEL
Albumin: 4.4 g/dL (ref 3.5–5.2)
Alkaline Phosphatase: 63 U/L (ref 39–117)
BUN: 20 mg/dL (ref 6–23)
CO2: 29 mEq/L (ref 19–32)
Glucose, Bld: 87 mg/dL (ref 70–99)
Total Bilirubin: 0.7 mg/dL (ref 0.3–1.2)

## 2011-11-09 LAB — LIPID PANEL
Cholesterol: 263 mg/dL — ABNORMAL HIGH (ref 0–200)
HDL: 102 mg/dL (ref >39)
Total CHOL/HDL Ratio: 2.6 Ratio
VLDL: 27 mg/dL (ref 0–40)

## 2011-11-09 LAB — CBC WITH DIFFERENTIAL/PLATELET
Eosinophils Absolute: 0.4 10*3/uL (ref 0.0–0.7)
Hemoglobin: 13.5 g/dL (ref 12.0–15.0)
Lymphocytes Relative: 32 % (ref 12–46)
Lymphs Abs: 2.3 10*3/uL (ref 0.7–4.0)
MCH: 30.8 pg (ref 26.0–34.0)
MCV: 97 fL (ref 78.0–100.0)
Monocytes Relative: 14 % — ABNORMAL HIGH (ref 3–12)
Neutrophils Relative %: 48 % (ref 43–77)
RBC: 4.39 MIL/uL (ref 3.87–5.11)
WBC: 7.1 10*3/uL (ref 4.0–10.5)

## 2011-11-14 ENCOUNTER — Ambulatory Visit (INDEPENDENT_AMBULATORY_CARE_PROVIDER_SITE_OTHER): Payer: BC Managed Care – PPO | Admitting: Family Medicine

## 2011-11-14 ENCOUNTER — Encounter: Payer: Self-pay | Admitting: Family Medicine

## 2011-11-14 VITALS — BP 189/90 | HR 86 | Ht 64.0 in | Wt 144.0 lb

## 2011-11-14 DIAGNOSIS — I10 Essential (primary) hypertension: Secondary | ICD-10-CM

## 2011-11-14 DIAGNOSIS — Z9181 History of falling: Secondary | ICD-10-CM

## 2011-11-14 DIAGNOSIS — E875 Hyperkalemia: Secondary | ICD-10-CM

## 2011-11-14 DIAGNOSIS — Z1331 Encounter for screening for depression: Secondary | ICD-10-CM

## 2011-11-14 DIAGNOSIS — K130 Diseases of lips: Secondary | ICD-10-CM

## 2011-11-14 DIAGNOSIS — E785 Hyperlipidemia, unspecified: Secondary | ICD-10-CM

## 2011-11-14 NOTE — Progress Notes (Signed)
  Subjective:    Patient ID: Kimberly Durham, female    DOB: 07/09/1936, 76 y.o.   MRN: [id number]  HPI HNT- BPs at home 128-130/68-72. Does have white coat syndrome.  VChecks it twice a week. Repeat 175//89.  Here to review her labwork she had done a week ago.   Dermatology - Micah Flesher to see derm about a new lesion on her right lower lip. They injected it with a steroid but says will come and go. Says will get smaller and larger. Also called the nurse line. Not painful.   Hyperlipidemai- she doesn't want to take a statin so did start red yeast rice.  Here ro review labs.     Review of Systems     Objective:   Physical Exam  Constitutional: She is oriented to person, place, and time. She appears well-developed and well-nourished.  HENT:  Head: Normocephalic and atraumatic.  Cardiovascular: Normal rate, regular rhythm and normal heart sounds.   Pulmonary/Chest: Effort normal and breath sounds normal.  Musculoskeletal: She exhibits no edema.  Neurological: She is alert and oriented to person, place, and time.  Skin: Skin is warm and dry.  Psychiatric: She has a normal mood and affect. Her behavior is normal.          Assessment & Plan:  HTN- Doing well.  Home BPs are normal.   Lip lesion unclear etiology. Reassured not likely cancer or infection based on exam.   Hyperlpidemia - REviewed labs Chol is best it has ever looked. Inc red yeast rice extract to 2 tabs. Recheck in 6 months. Continue to work on exercise and diet  Hyperkalemia - Recheck Potassium today. Pt says had been eating a lot of salty foods.   Depression screening-PHQ 9 score is 0. Negative for depression.  Fall assessment-score of 2, low risk for falls.

## 2011-11-15 NOTE — Progress Notes (Signed)
Quick Note:  All labs are normal. ______ 

## 2011-12-03 ENCOUNTER — Ambulatory Visit (INDEPENDENT_AMBULATORY_CARE_PROVIDER_SITE_OTHER): Payer: BC Managed Care – PPO | Admitting: Family Medicine

## 2011-12-03 ENCOUNTER — Other Ambulatory Visit: Payer: Self-pay | Admitting: Family Medicine

## 2011-12-03 ENCOUNTER — Encounter: Payer: Self-pay | Admitting: Family Medicine

## 2011-12-03 VITALS — BP 181/80 | HR 104 | Ht 64.0 in | Wt 146.0 lb

## 2011-12-03 DIAGNOSIS — R221 Localized swelling, mass and lump, neck: Secondary | ICD-10-CM

## 2011-12-03 DIAGNOSIS — R220 Localized swelling, mass and lump, head: Secondary | ICD-10-CM

## 2011-12-03 NOTE — Progress Notes (Signed)
  Subjective:    Patient ID: Kimberly Durham, female    DOB: 1936/01/04, 76 y.o.   MRN: [id number]  HPI Noticed lump in her throat about 2 weeks ago.  Has had some frequent HA lately, HA (though hx of migraines). Has had some extreme fatigue.  Feel some better today. No dsyphagia.  Home BPs still running normal at 180.  No cough or SOB or wheeze.  Quit taking her allergy pill. Takes vita D and vitamin C. No recent URI.  NO dysphagia. No ear or throat pain.  No hx of thyroid problems. No trauma. Not painful.     Review of Systems     Objective:   Physical Exam  Constitutional: She is oriented to person, place, and time. She appears well-developed and well-nourished.  HENT:  Head: Normocephalic and atraumatic.  Right Ear: External ear normal.  Left Ear: External ear normal.  Nose: Nose normal.  Mouth/Throat: Oropharynx is clear and moist.       Neck with slight soft bulge at the suprasternal notch. It feels like fatty tissue it does not feel like a discrete nodule or lymph node. It is not tender. No erythema. No induration. No other palpable supraclavicular or cervical nodes or posterior her regular meds.  Eyes: Conjunctivae and EOM are normal. Pupils are equal, round, and reactive to light.  Neck: Neck supple. No thyromegaly present.  Cardiovascular: Normal rate, regular rhythm and normal heart sounds.   Pulmonary/Chest: Effort normal and breath sounds normal. She has no wheezes.  Lymphadenopathy:    She has no cervical adenopathy.  Neurological: She is alert and oriented to person, place, and time.  Skin: Skin is warm and dry.  Psychiatric: She has a normal mood and affect.          Assessment & Plan:  Swelling at the suprasternal notch-will check a CBC and TSH today. There currently and feels normal and does not feel enlarged. No nodules. Consider further evaluation with possible ultrasound on an empty speak with the radiologist to make sure that we are ordering the right test to  actually get that location in the scan.

## 2011-12-04 ENCOUNTER — Other Ambulatory Visit: Payer: Self-pay | Admitting: Family Medicine

## 2011-12-04 LAB — CBC WITH DIFFERENTIAL/PLATELET
Basophils Absolute: 0.1 10*3/uL (ref 0.0–0.1)
Basophils Relative: 1 % (ref 0–1)
Eosinophils Absolute: 0.3 10*3/uL (ref 0.0–0.7)
Eosinophils Relative: 4 % (ref 0–5)
HCT: 40.1 % (ref 36.0–46.0)
MCH: 30.5 pg (ref 26.0–34.0)
MCHC: 32.4 g/dL (ref 30.0–36.0)
MCV: 94.1 fL (ref 78.0–100.0)
Monocytes Absolute: 0.8 10*3/uL (ref 0.1–1.0)
Platelets: 343 10*3/uL (ref 150–400)
RDW: 13.5 % (ref 11.5–15.5)
WBC: 9.1 10*3/uL (ref 4.0–10.5)

## 2011-12-06 ENCOUNTER — Ambulatory Visit
Admission: RE | Admit: 2011-12-06 | Discharge: 2011-12-06 | Disposition: A | Discharge status: Home / Self Care | Payer: Medicare Other | Source: Ambulatory Visit | Attending: Family Medicine | Admitting: Family Medicine

## 2011-12-06 DIAGNOSIS — R220 Localized swelling, mass and lump, head: Secondary | ICD-10-CM

## 2011-12-25 ENCOUNTER — Other Ambulatory Visit: Payer: Self-pay | Admitting: Family Medicine

## 2012-05-09 ENCOUNTER — Telehealth: Payer: Self-pay | Admitting: *Deleted

## 2012-05-09 DIAGNOSIS — I10 Essential (primary) hypertension: Secondary | ICD-10-CM

## 2012-05-09 DIAGNOSIS — E785 Hyperlipidemia, unspecified: Secondary | ICD-10-CM

## 2012-05-09 NOTE — Telephone Encounter (Signed)
Sounds good. Just add TSH since her last one was borderline.

## 2012-05-09 NOTE — Telephone Encounter (Signed)
LM for pt to go to lab next week fasting to have labs drawn.

## 2012-05-09 NOTE — Telephone Encounter (Signed)
Pt has CPE next week with you and would like to get labs done so you will have them to go over with her. Lipid panel, CMP anything else?

## 2012-05-12 LAB — CHOLESTEROL, TOTAL: Cholesterol: 282 mg/dL — ABNORMAL HIGH (ref 0–200)

## 2012-05-12 LAB — COMPREHENSIVE METABOLIC PANEL
ALT: 16 U/L (ref 0–35)
AST: 20 U/L (ref 0–37)
Albumin: 4.5 g/dL (ref 3.5–5.2)
CO2: 28 mEq/L (ref 19–32)
Calcium: 9.8 mg/dL (ref 8.4–10.5)
Chloride: 100 mEq/L (ref 96–112)
Potassium: 4.7 mEq/L (ref 3.5–5.3)
Total Protein: 7.1 g/dL (ref 6.0–8.3)

## 2012-05-12 LAB — TSH: TSH: 5.245 u[IU]/mL — ABNORMAL HIGH (ref 0.350–4.500)

## 2012-05-13 ENCOUNTER — Other Ambulatory Visit: Payer: Self-pay | Admitting: Family Medicine

## 2012-05-13 LAB — LIPID PANEL
Cholesterol: 269 mg/dL — ABNORMAL HIGH (ref 0–200)
LDL Cholesterol: 139 mg/dL — ABNORMAL HIGH (ref 0–99)
Triglycerides: 144 mg/dL (ref <150)
VLDL: 29 mg/dL (ref 0–40)

## 2012-05-13 LAB — T3, FREE: T3, Free: 2.8 pg/mL (ref 2.3–4.2)

## 2012-05-13 NOTE — Addendum Note (Signed)
Addended by: Ellsworth Lennox on: 05/13/2012 09:51 AM   Modules accepted: Orders

## 2012-05-13 NOTE — Addendum Note (Signed)
Addended by: Ellsworth Lennox on: 05/13/2012 09:49 AM   Modules accepted: Orders

## 2012-05-15 ENCOUNTER — Ambulatory Visit (INDEPENDENT_AMBULATORY_CARE_PROVIDER_SITE_OTHER): Payer: Medicare Other | Admitting: Family Medicine

## 2012-05-15 ENCOUNTER — Encounter: Payer: Self-pay | Admitting: Family Medicine

## 2012-05-15 VITALS — BP 155/90 | HR 66 | Ht 64.0 in | Wt 144.0 lb

## 2012-05-15 DIAGNOSIS — I10 Essential (primary) hypertension: Secondary | ICD-10-CM

## 2012-05-15 DIAGNOSIS — E785 Hyperlipidemia, unspecified: Secondary | ICD-10-CM

## 2012-05-15 DIAGNOSIS — Z23 Encounter for immunization: Secondary | ICD-10-CM

## 2012-05-15 NOTE — Addendum Note (Signed)
Addended by: Judie Petit A on: 05/15/2012 09:23 AM   Modules accepted: Orders

## 2012-05-15 NOTE — Progress Notes (Signed)
  Subjective:    Patient ID: Kimberly Durham, female    DOB: 11/11/1935, 76 y.o.   MRN: [id number]  HPI HTN - F/U home BPs 130/70s.  We have verifed her home BP machine.  Following a low salt diet.  Works out daily on [employer].  No CP or SOB.  3 glasses of wine per week.  Had a salivary gland removed this summer by Dr. Manson Passey.    Hyperlpidemia - Refuses stating and taking red yeast rice and does exercise daily. Lean meats.    Review of Systems     Objective:   Physical Exam  Constitutional: She is oriented to person, place, and time. She appears well-developed and well-nourished.  HENT:  Head: Normocephalic and atraumatic.  Cardiovascular: Normal rate, regular rhythm and normal heart sounds.   Pulmonary/Chest: Effort normal and breath sounds normal.  Neurological: She is alert and oriented to person, place, and time.  Skin: Skin is warm and dry.  Psychiatric: She has a normal mood and affect. Her behavior is normal.          Assessment & Plan:  HTN - WEll controlled at home.  Continue current regimen. Had labs recently  Hyperlipidemia-it is improved overall with diet exercise and red yeast rice. Her LDL is under 160. Though I would like to see lower but she declines a statin.  Flu vaccine and tdap updated today.

## 2012-05-20 ENCOUNTER — Telehealth: Payer: Self-pay | Admitting: *Deleted

## 2012-05-20 DIAGNOSIS — N951 Menopausal and female climacteric states: Secondary | ICD-10-CM

## 2012-05-20 NOTE — Telephone Encounter (Signed)
Pt calls and states she is due for bone density has been 2 years. Need order put in and would like to have done downstairs

## 2012-05-20 NOTE — Telephone Encounter (Signed)
Will order.

## 2012-05-27 ENCOUNTER — Ambulatory Visit (INDEPENDENT_AMBULATORY_CARE_PROVIDER_SITE_OTHER): Payer: Medicare Other

## 2012-05-27 DIAGNOSIS — N951 Menopausal and female climacteric states: Secondary | ICD-10-CM

## 2012-05-27 DIAGNOSIS — M810 Age-related osteoporosis without current pathological fracture: Secondary | ICD-10-CM

## 2012-07-21 ENCOUNTER — Other Ambulatory Visit: Payer: Self-pay | Admitting: Family Medicine

## 2012-09-25 ENCOUNTER — Encounter: Payer: Self-pay | Admitting: Family Medicine

## 2012-09-30 ENCOUNTER — Encounter: Payer: Self-pay | Admitting: Family Medicine

## 2012-11-10 ENCOUNTER — Telehealth: Payer: Self-pay | Admitting: *Deleted

## 2012-11-10 DIAGNOSIS — E039 Hypothyroidism, unspecified: Secondary | ICD-10-CM

## 2012-11-10 NOTE — Telephone Encounter (Signed)
Split printed for thyroid. I think that is the only think that we are checking.

## 2012-11-10 NOTE — Telephone Encounter (Signed)
Pt has appt on 4/24 and would like to get labs doen beforehand

## 2012-11-10 NOTE — Telephone Encounter (Signed)
Pt.notified

## 2012-11-13 ENCOUNTER — Ambulatory Visit (INDEPENDENT_AMBULATORY_CARE_PROVIDER_SITE_OTHER): Payer: Medicare Other | Admitting: Family Medicine

## 2012-11-13 ENCOUNTER — Encounter: Payer: Self-pay | Admitting: Family Medicine

## 2012-11-13 VITALS — BP 140/80 | HR 79 | Ht 64.0 in | Wt 146.0 lb

## 2012-11-13 DIAGNOSIS — H6122 Impacted cerumen, left ear: Secondary | ICD-10-CM

## 2012-11-13 DIAGNOSIS — G43109 Migraine with aura, not intractable, without status migrainosus: Secondary | ICD-10-CM

## 2012-11-13 DIAGNOSIS — I10 Essential (primary) hypertension: Secondary | ICD-10-CM

## 2012-11-13 NOTE — Progress Notes (Signed)
  Subjective:    Patient ID: Kimberly Durham, female    DOB: [birth year], 76 y.o.   MRN: [id number]  HPI HTN, white coat-  Pt denies chest pain, SOB, dizziness, or heart palpitations.  Taking meds as directed w/o problems.  Denies medication side effects. Borught ih home log and BPS running 120s-130s.  She has now retired.  She is not on mediction.    Dyslipidemia -  Lab Results  Component Value Date   CHOL 269* 05/13/2012   HDL 101 05/13/2012   LDLCALC 139* 05/13/2012   LDLDIRECT 141* 09/19/2007   TRIG 144 05/13/2012   CHOLHDL 2.7 05/13/2012     Thyroid levels wree normal. Did [place] back in the fall.   Review of Systems     Objective:   Physical Exam  Constitutional: She is oriented to person, place, and time. She appears well-developed and well-nourished.  HENT:  Head: Normocephalic and atraumatic.  Neck: Neck supple. No thyromegaly present.  Cardiovascular: Normal rate, regular rhythm and normal heart sounds.   Pulmonary/Chest: Effort normal and breath sounds normal.  Musculoskeletal: She exhibits no edema.  Lymphadenopathy:    She has no cervical adenopathy.  Neurological: She is alert and oriented to person, place, and time.  Skin: Skin is warm and dry.  Psychiatric: She has a normal mood and affect. Her behavior is normal.          Assessment & Plan:  HTN - BPs well controlled.  Continue exercise and low salt diet. F/U  In 6 months for wellness exam.   Colon cancer screening - Givne stool cards today.   Thyroid level are nomral. Reviewed results wth her.   Exams are up-to-date. She does not need a Pap smear she has had a hysterectomy.

## 2012-12-08 ENCOUNTER — Telehealth (INDEPENDENT_AMBULATORY_CARE_PROVIDER_SITE_OTHER): Payer: Medicare Other | Admitting: *Deleted

## 2012-12-08 DIAGNOSIS — Z1211 Encounter for screening for malignant neoplasm of colon: Secondary | ICD-10-CM

## 2012-12-08 LAB — POC HEMOCCULT BLD/STL (HOME/3-CARD/SCREEN)
Card #3 Fecal Occult Blood, POC: NEGATIVE
Fecal Occult Blood, POC: NEGATIVE

## 2013-02-17 ENCOUNTER — Other Ambulatory Visit: Payer: Self-pay | Admitting: Family Medicine

## 2013-05-11 ENCOUNTER — Other Ambulatory Visit: Payer: Self-pay | Admitting: Family Medicine

## 2013-05-11 DIAGNOSIS — F419 Anxiety disorder, unspecified: Secondary | ICD-10-CM

## 2013-05-11 DIAGNOSIS — I10 Essential (primary) hypertension: Secondary | ICD-10-CM

## 2013-05-11 DIAGNOSIS — E785 Hyperlipidemia, unspecified: Secondary | ICD-10-CM

## 2013-05-12 LAB — LIPID PANEL
HDL: 114 mg/dL (ref >39)
LDL Cholesterol: 147 mg/dL — ABNORMAL HIGH (ref 0–99)

## 2013-05-12 LAB — CBC WITH DIFFERENTIAL/PLATELET
Eosinophils Relative: 5 % (ref 0–5)
HCT: 40.6 % (ref 36.0–46.0)
Hemoglobin: 13.8 g/dL (ref 12.0–15.0)
Lymphocytes Relative: 35 % (ref 12–46)
Lymphs Abs: 2.7 10*3/uL (ref 0.7–4.0)
MCV: 90.8 fL (ref 78.0–100.0)
Monocytes Absolute: 1 10*3/uL (ref 0.1–1.0)
Monocytes Relative: 13 % — ABNORMAL HIGH (ref 3–12)
Neutro Abs: 3.7 10*3/uL (ref 1.7–7.7)
RBC: 4.47 MIL/uL (ref 3.87–5.11)
RDW: 14 % (ref 11.5–15.5)
WBC: 7.8 10*3/uL (ref 4.0–10.5)

## 2013-05-12 LAB — COMPLETE METABOLIC PANEL WITH GFR
CO2: 23 mEq/L (ref 19–32)
Calcium: 9.5 mg/dL (ref 8.4–10.5)
Chloride: 99 mEq/L (ref 96–112)
Creat: 0.92 mg/dL (ref 0.50–1.10)
GFR, Est African American: 70 mL/min
GFR, Est Non African American: 61 mL/min
Glucose, Bld: 94 mg/dL (ref 70–99)
Sodium: 136 mEq/L (ref 135–145)
Total Bilirubin: 0.8 mg/dL (ref 0.3–1.2)
Total Protein: 7.2 g/dL (ref 6.0–8.3)

## 2013-05-15 ENCOUNTER — Encounter: Payer: Self-pay | Admitting: Family Medicine

## 2013-05-15 ENCOUNTER — Ambulatory Visit (INDEPENDENT_AMBULATORY_CARE_PROVIDER_SITE_OTHER): Payer: Medicare Other | Admitting: Family Medicine

## 2013-05-15 VITALS — BP 161/82 | HR 72 | Wt 141.0 lb

## 2013-05-15 DIAGNOSIS — Z0000 Encounter for general adult medical examination without abnormal findings: Secondary | ICD-10-CM

## 2013-05-15 DIAGNOSIS — Z23 Encounter for immunization: Secondary | ICD-10-CM

## 2013-05-15 NOTE — Progress Notes (Signed)
Subjective:    Kimberly Durham is a 77 y.o. female who presents for Medicare Annual/Subsequent preventive examination.  Preventive Screening-Counseling & Management  Tobacco History  Smoking status  . Never Smoker   Smokeless tobacco  . Not on file     Problems Prior to Visit 1.   Current Problems (verified) Patient Active Problem List   Diagnosis Date Noted  . Migraine with aura 11/13/2012  . FATIGUE 10/11/2009  . HYPERKALEMIA 12/02/2007  . DYSLIPIDEMIA 03/27/2007  . HYPERTENSION, WHITE COAT 07/04/2006  . ANXIETY 05/17/2006  . OSTEOPOROSIS 05/17/2006    Medications Prior to Visit Current Outpatient Prescriptions on File Prior to Visit  Medication Sig Dispense Refill  . calcium carbonate (OS-CAL) 1250 MG tablet Take 1 tablet by mouth 2 (two) times daily.        . fish oil-omega-3 fatty acids 1000 MG capsule Take 1 g by mouth 3 (three) times daily.        Marland Kitchen LORazepam (ATIVAN) 1 MG tablet take 1/2 tablet by mouth twice a day if needed  30 tablet  2  . Multiple Vitamin (MULTIVITAMIN) tablet Take 1 tablet by mouth daily.        . Red Yeast Rice 600 MG CAPS Take 1 capsule by mouth.      . Vitamin D, Ergocalciferol, (DRISDOL) 50000 UNITS CAPS Take 50,000 Units by mouth every 7 (seven) days.         No current facility-administered medications on file prior to visit.    Current Medications (verified) Current Outpatient Prescriptions  Medication Sig Dispense Refill  . calcium carbonate (OS-CAL) 1250 MG tablet Take 1 tablet by mouth 2 (two) times daily.        . fish oil-omega-3 fatty acids 1000 MG capsule Take 1 g by mouth 3 (three) times daily.        Marland Kitchen LORazepam (ATIVAN) 1 MG tablet take 1/2 tablet by mouth twice a day if needed  30 tablet  2  . Multiple Vitamin (MULTIVITAMIN) tablet Take 1 tablet by mouth daily.        . Red Yeast Rice 600 MG CAPS Take 1 capsule by mouth.      . Vitamin D, Ergocalciferol, (DRISDOL) 50000 UNITS CAPS Take 50,000 Units by mouth every 7 (seven)  days.         No current facility-administered medications for this visit.     Allergies (verified) Codeine; Epinephrine; Prednisone; and Sulfonamide derivatives   PAST HISTORY  Family History Family History  Problem Relation Age of Onset  . Heart disease Mother   . Diabetes Mother   . COPD Father     smoker  . Stroke Brother 82  . Parkinsonism Brother     Social History History  Substance Use Topics  . Smoking status: Never Smoker   . Smokeless tobacco: Not on file  . Alcohol Use: Yes     Are there smokers in your home (other than you)? No  Risk Factors Current exercise habits: walks and stretches daily  Dietary issues discussed: none   Cardiac risk factors: advanced age (older than 39 for men, 74 for women).  Depression Screen (Note: if answer to either of the following is "Yes", a more complete depression screening is indicated)   Over the past two weeks, have you felt down, depressed or hopeless? No  Over the past two weeks, have you felt little interest or pleasure in doing things? No  Have you lost interest or pleasure in daily life?  No  Do you often feel hopeless? No  Do you cry easily over simple problems? No  Activities of Daily Living In your present state of health, do you have any difficulty performing the following activities?:  Driving? No Managing money?  No Feeding yourself? No Getting from bed to chair? No Climbing a flight of stairs? No Preparing food and eating?: No Bathing or showering? No Getting dressed: No Getting to the toilet? No Using the toilet:No Moving around from place to place: No In the past year have you fallen or had a near fall?:No   Are you sexually active?  Yes  Do you have more than one partner?  No  Hearing Difficulties: No Do you often ask people to speak up or repeat themselves? No Do you experience ringing or noises in your ears? Yes Do you have difficulty understanding soft or whispered voices? No   Do you  feel that you have a problem with memory? No  Do you often misplace items? No  Do you feel safe at home?  Yes  Cognitive Testing  Alert? Yes  Normal Appearance?Yes  Oriented to person? Yes  Place? Yes   Time? Yes  Recall of three objects?  Yes  Can perform simple calculations? Yes  Displays appropriate judgment?Yes  Can read the correct time from a watch face?Yes   Advanced Directives have been discussed with the patient? Yes  List the Names of Other Physician/Practitioners you currently use: 1.  Dr. Junius Roads -Derm 2. Linton Rump Clearance Coots - optometry 3. Dr. Jamey Reas - dentist  Indicate any recent Medical Services you may have received from other than [facility] providers in the past year (date may be approximate).  Immunization History  Administered Date(s) Administered  . Influenza Split 05/15/2012  . Influenza Whole 05/26/2007, 04/20/2008, 04/18/2009, 04/25/2010  . Influenza,inj,Quad PF,36+ Mos 05/15/2013  . Pneumococcal Polysaccharide 03/04/2003  . Td 02/20/2002  . Tdap 05/15/2012  . Zoster 04/25/2010    Screening Tests Health Maintenance  Topic Date Due  . Influenza Vaccine  02/20/2013  . Mammogram  09/10/2013  . Colonoscopy  05/15/2021  . Tetanus/tdap  05/15/2022  . Pneumococcal Polysaccharide Vaccine Age 83 And Over  Completed  . Zostavax  Completed    All answers were reviewed with the patient and necessary referrals were made:  Kimberly Benegas, MD   05/15/2013   History reviewed: allergies, current medications, past family history, past medical history, past social history, past surgical history and problem list  Review of Systems A comprehensive review of systems was negative.    Objective:     Vision by Snellen chart: Last vision check was in April with Dr. Octavia Heir  Body mass index is 24.19 kg/(m^2). BP 161/82  Pulse 72  Wt 141 lb (63.957 kg)  BMI 24.19 kg/m2  BP 161/82  Pulse 72  Wt 141 lb (63.957 kg)  BMI 24.19 kg/m2  General Appearance:     Alert, cooperative, no distress, appears stated age  Head:    Normocephalic, without obvious abnormality, atraumatic  Eyes:    PERRL, conjunctiva/corneas clear, EOM's intact, both eyes  Ears:    Normal TM's and external ear canals, both ears  Nose:   Nares normal, septum midline, mucosa normal, no drainage    or sinus tenderness  Throat:   Lips, mucosa, and tongue normal; teeth and gums normal  Neck:   Supple, symmetrical, trachea midline, no adenopathy;    thyroid:  no enlargement/tenderness/nodules; no carotid   bruit or JVD  Back:     Symmetric, no curvature, ROM normal, no CVA tenderness  Lungs:     Clear to auscultation bilaterally, respirations unlabored  Chest Wall:    No tenderness or deformity   Heart:    Regular rate and rhythm, S1 and S2 normal, no murmur, rub   or gallop  Breast Exam:    No tenderness, masses, or nipple abnormality  Abdomen:     Soft, non-tender, bowel sounds active all four quadrants,    no masses, no organomegaly  Genitalia:    Not performed  Rectal:    Not performed  Extremities:   Extremities normal, atraumatic, no cyanosis or edema  Pulses:   2+ and symmetric all extremities  Skin:   Skin color, texture, turgor normal, no rashes or lesions  Lymph nodes:   Cervical, supraclavicular, and axillary nodes normal  Neurologic:   CNII-XII intact, normal strength, sensation and reflexes    throughout       Assessment:     Medicare Wellness Exam       Plan:     During the course of the visit the patient was educated and counseled about appropriate screening and preventive services including:    home BPs look great.   Vaccines are up to date  Has flu vaccine  Given copy of labs and reviewed with patient  Encouraged her to activate MyChart  Eye exam is UTD  Depression and fall scrrning done  Has advanced directives.   Diet review for nutrition referral? Yes ____  Not Indicated _x_   Patient Instructions (the written plan) was given to  the patient.  Medicare Attestation I have personally reviewed: The patient's medical and social history Their use of alcohol, tobacco or illicit drugs Their current medications and supplements The patient's functional ability including ADLs,fall risks, home safety risks, cognitive, and hearing and visual impairment Diet and physical activities Evidence for depression or mood disorders  The patient's weight, height, BMI, and visual acuity have been recorded in the chart.  I have made referrals, counseling, and provided education to the patient based on review of the above and I have provided the patient with a written personalized care plan for preventive services.     Hidaya Daniel, MD   05/15/2013

## 2013-05-15 NOTE — Patient Instructions (Signed)
Keep up a regular exercise program and make sure you are eating a healthy diet Try to eat 4 servings of dairy a day, or if you are lactose intolerant take a calcium with vitamin D daily.  Your vaccines are up to date.   

## 2013-08-12 ENCOUNTER — Other Ambulatory Visit: Payer: Self-pay | Admitting: Family Medicine

## 2013-10-06 ENCOUNTER — Encounter: Payer: Self-pay | Admitting: *Deleted

## 2013-10-21 ENCOUNTER — Other Ambulatory Visit: Payer: Self-pay | Admitting: Family Medicine

## 2013-11-06 ENCOUNTER — Encounter: Payer: Self-pay | Admitting: Family Medicine

## 2013-11-09 ENCOUNTER — Telehealth: Payer: Self-pay | Admitting: *Deleted

## 2013-11-09 DIAGNOSIS — R5381 Other malaise: Secondary | ICD-10-CM

## 2013-11-09 DIAGNOSIS — I10 Essential (primary) hypertension: Secondary | ICD-10-CM

## 2013-11-09 DIAGNOSIS — R5383 Other fatigue: Secondary | ICD-10-CM

## 2013-11-09 NOTE — Telephone Encounter (Signed)
Pt has an appt with you on 4/24 and would like to get her bloodwork done prior to appt. What labs would you like to have placed? Meyer CoryMisty Jahliyah Trice, LPN

## 2013-11-09 NOTE — Telephone Encounter (Signed)
Just a cmp and tsh.  Thank you

## 2013-11-10 ENCOUNTER — Other Ambulatory Visit: Payer: Self-pay | Admitting: Family Medicine

## 2013-11-10 LAB — COMPLETE METABOLIC PANEL WITH GFR
ALT: 20 U/L (ref 0–35)
AST: 26 U/L (ref 0–37)
Albumin: 4.6 g/dL (ref 3.5–5.2)
Alkaline Phosphatase: 59 U/L (ref 39–117)
BILIRUBIN TOTAL: 1 mg/dL (ref 0.2–1.2)
BUN: 14 mg/dL (ref 6–23)
CALCIUM: 9.6 mg/dL (ref 8.4–10.5)
CO2: 28 mEq/L (ref 19–32)
CREATININE: 0.83 mg/dL (ref 0.50–1.10)
Chloride: 98 mEq/L (ref 96–112)
GFR, EST AFRICAN AMERICAN: 79 mL/min
GFR, Est Non African American: 69 mL/min
Glucose, Bld: 97 mg/dL (ref 70–99)
Potassium: 4 mEq/L (ref 3.5–5.3)
Sodium: 134 mEq/L — ABNORMAL LOW (ref 135–145)
Total Protein: 7.5 g/dL (ref 6.0–8.3)

## 2013-11-10 LAB — TSH: TSH: 4.553 u[IU]/mL — ABNORMAL HIGH (ref 0.350–4.500)

## 2013-11-11 LAB — LIPID PANEL
CHOL/HDL RATIO: 2.8 ratio
CHOLESTEROL: 324 mg/dL — AB (ref 0–200)
HDL: 115 mg/dL (ref >39)
LDL Cholesterol: 183 mg/dL — ABNORMAL HIGH (ref 0–99)
TRIGLYCERIDES: 128 mg/dL (ref <150)
VLDL: 26 mg/dL (ref 0–40)

## 2013-11-11 LAB — T3, FREE: T3 FREE: 2.5 pg/mL (ref 2.3–4.2)

## 2013-11-11 LAB — T4, FREE: Free T4: 1.06 ng/dL (ref 0.80–1.80)

## 2013-11-13 ENCOUNTER — Ambulatory Visit (INDEPENDENT_AMBULATORY_CARE_PROVIDER_SITE_OTHER): Payer: Medicare Other | Admitting: Family Medicine

## 2013-11-13 ENCOUNTER — Encounter: Payer: Self-pay | Admitting: Family Medicine

## 2013-11-13 VITALS — BP 172/80 | HR 70 | Ht 64.0 in | Wt 145.0 lb

## 2013-11-13 DIAGNOSIS — R946 Abnormal results of thyroid function studies: Secondary | ICD-10-CM

## 2013-11-13 DIAGNOSIS — E785 Hyperlipidemia, unspecified: Secondary | ICD-10-CM

## 2013-11-13 DIAGNOSIS — H9201 Otalgia, right ear: Secondary | ICD-10-CM

## 2013-11-13 DIAGNOSIS — H9209 Otalgia, unspecified ear: Secondary | ICD-10-CM

## 2013-11-13 DIAGNOSIS — R7989 Other specified abnormal findings of blood chemistry: Secondary | ICD-10-CM

## 2013-11-13 DIAGNOSIS — R030 Elevated blood-pressure reading, without diagnosis of hypertension: Secondary | ICD-10-CM

## 2013-11-13 NOTE — Progress Notes (Signed)
   Subjective:    Patient ID: Kimberly Durham, female    DOB: May 21, 1936, 78 y.o.   MRN: [id number]  HPI Hypertension- Pt denies chest pain, SOB, dizziness, or heart palpitations.  Taking meds as directed w/o problems.  Denies medication side effects.  She did bring in a log of home blood pressures with her today. Home blood pressures primarily running in the upper 120s to 130s. As the diastolics are in the 60s to 70s. Pulse primarily runs in the upper 50s to 60s.  Hyperlipidemia- here to review some results. Had been eating a lot of cheese and chocolate.  Has been walking 1.5 - 2 miles a day.  Lab Results  Component Value Date   CHOL 324* 11/10/2013   HDL 115 11/10/2013   LDLCALC 183* 11/10/2013   LDLDIRECT 141* 09/19/2007   TRIG 128 11/10/2013   CHOLHDL 2.8 11/10/2013    Abnormal thyroid tests-she would like to discuss the results today. She denies any cold intolerance, hair loss, skin changes, or fatigue.  She has had a little bit of discomfort coming and going in her right ear. She thinks is allergy related. She notices it mostly when she feels congested but just wants me to check her ear. Review of Systems     Objective:   Physical Exam  Constitutional: She is oriented to person, place, and time. She appears well-developed and well-nourished.  HENT:  Head: Normocephalic and atraumatic.  Right Ear: External ear normal.  Left Ear: External ear normal.  Nose: Nose normal.  Mouth/Throat: Oropharynx is clear and moist.  Right tympanic membrane is blocked by cerumen. I'm unable to visualize the membrane. Otherwise the canal appears normal. The left membrane and canal are clear.  Eyes: Conjunctivae and EOM are normal. Pupils are equal, round, and reactive to light.  Neck: Neck supple. No thyromegaly present.  Cardiovascular: Normal rate, regular rhythm and normal heart sounds.   No carotid bruits  Pulmonary/Chest: Effort normal and breath sounds normal. She has no wheezes.  Lymphadenopathy:     She has no cervical adenopathy.  Neurological: She is alert and oriented to person, place, and time.  Skin: Skin is warm and dry.  Psychiatric: She has a normal mood and affect. Her behavior is normal.          Assessment & Plan:  Hypertension-Home BPs well controlled.  Continue current regimen. Followup in 6 months.  Hyperlipidemia-Will recheck level in 6 month.  Get back on track with diet and exercise.    Abnormal thyroid test-reviewed results. Will continue to montor in 6 months. Reviewed with her that right now her thyroid is functioning normally. The she could become overtly hyperthyroid at some point.  Right otalgia-most likely secondary to allergic rhinitis. I was unable to visualize the tympanic membrane because of cerumen. Offered to irrigate the ear today but she declined. She will let me know if it gets worse. It's not hurting today.

## 2013-12-04 ENCOUNTER — Ambulatory Visit (INDEPENDENT_AMBULATORY_CARE_PROVIDER_SITE_OTHER): Payer: Medicare Other | Admitting: Family Medicine

## 2013-12-04 DIAGNOSIS — H6120 Impacted cerumen, unspecified ear: Secondary | ICD-10-CM

## 2013-12-04 NOTE — Progress Notes (Signed)
   Subjective:    Patient ID: Kimberly Durham, female    DOB: 1935/11/23, 78 y.o.   MRN: [id number]  HPI  Here for irrigation of both ears for bilateral cerumen impaction. No pain or drainage from the ears.  Review of Systems     Objective:   Physical Exam        Assessment & Plan:  Cerumen impaction-patient irrigated and tolerated well. Followup as needed. Nani Gasseratherine Melondy Blanchard, MD

## 2013-12-21 ENCOUNTER — Other Ambulatory Visit: Payer: Self-pay | Admitting: Family Medicine

## 2014-02-16 ENCOUNTER — Other Ambulatory Visit: Payer: Self-pay | Admitting: Family Medicine

## 2014-04-15 ENCOUNTER — Other Ambulatory Visit: Payer: Self-pay | Admitting: Family Medicine

## 2014-05-10 ENCOUNTER — Encounter: Payer: Self-pay | Admitting: Family Medicine

## 2014-05-11 ENCOUNTER — Other Ambulatory Visit: Payer: Self-pay | Admitting: *Deleted

## 2014-05-11 ENCOUNTER — Other Ambulatory Visit: Payer: Self-pay | Admitting: Family Medicine

## 2014-05-11 DIAGNOSIS — E785 Hyperlipidemia, unspecified: Secondary | ICD-10-CM

## 2014-05-11 DIAGNOSIS — R7989 Other specified abnormal findings of blood chemistry: Secondary | ICD-10-CM

## 2014-05-11 DIAGNOSIS — Z0000 Encounter for general adult medical examination without abnormal findings: Secondary | ICD-10-CM

## 2014-05-13 LAB — COMPLETE METABOLIC PANEL WITH GFR
ALBUMIN: 4.4 g/dL (ref 3.5–5.2)
ALK PHOS: 58 U/L (ref 39–117)
ALT: 18 U/L (ref 0–35)
AST: 21 U/L (ref 0–37)
BUN: 15 mg/dL (ref 6–23)
CO2: 25 meq/L (ref 19–32)
Calcium: 9.5 mg/dL (ref 8.4–10.5)
Chloride: 100 mEq/L (ref 96–112)
Creat: 1.01 mg/dL (ref 0.50–1.10)
GFR, EST NON AFRICAN AMERICAN: 54 mL/min — AB
GFR, Est African American: 62 mL/min
Glucose, Bld: 85 mg/dL (ref 70–99)
POTASSIUM: 4.4 meq/L (ref 3.5–5.3)
SODIUM: 137 meq/L (ref 135–145)
Total Bilirubin: 1 mg/dL (ref 0.2–1.2)
Total Protein: 7.4 g/dL (ref 6.0–8.3)

## 2014-05-13 LAB — LIPID PANEL
CHOL/HDL RATIO: 2.2 ratio
Cholesterol: 240 mg/dL — ABNORMAL HIGH (ref 0–200)
HDL: 109 mg/dL (ref >39)
LDL Cholesterol: 106 mg/dL — ABNORMAL HIGH (ref 0–99)
Triglycerides: 126 mg/dL (ref <150)
VLDL: 25 mg/dL (ref 0–40)

## 2014-05-13 LAB — T4, FREE: Free T4: 1.14 ng/dL (ref 0.80–1.80)

## 2014-05-13 LAB — T3, FREE: T3, Free: 3.1 pg/mL (ref 2.3–4.2)

## 2014-05-13 LAB — TSH: TSH: 5.795 u[IU]/mL — ABNORMAL HIGH (ref 0.350–4.500)

## 2014-05-17 ENCOUNTER — Encounter: Payer: Self-pay | Admitting: Family Medicine

## 2014-05-17 ENCOUNTER — Ambulatory Visit (INDEPENDENT_AMBULATORY_CARE_PROVIDER_SITE_OTHER): Payer: Medicare Other | Admitting: Family Medicine

## 2014-05-17 VITALS — BP 150/88 | HR 96 | Temp 97.7°F | Ht 64.0 in | Wt 147.0 lb

## 2014-05-17 DIAGNOSIS — Z0000 Encounter for general adult medical examination without abnormal findings: Secondary | ICD-10-CM

## 2014-05-17 DIAGNOSIS — Z23 Encounter for immunization: Secondary | ICD-10-CM

## 2014-05-17 NOTE — Patient Instructions (Signed)
Keep up a regular exercise program and make sure you are eating a healthy diet Try to eat 4 servings of dairy a day, or if you are lactose intolerant take a calcium with vitamin D daily.  Your vaccines are up to date.   

## 2014-05-17 NOTE — Progress Notes (Signed)
Subjective:    Kimberly Durham is a 78 y.o. female who presents for Medicare Annual/Subsequent preventive examination.  Preventive Screening-Counseling & Management  Tobacco History  Smoking status  . Never Smoker   Smokeless tobacco  . Not on file     Problems Prior to Visit 1. Whitecoat hypertension-home blood pressures have been primarily running in the 120s, 130s and a couple in the low 140 range. They are mostly in the 120s. Pulse typically ranges from 50-60.  Current Problems (verified) Patient Active Problem List   Diagnosis Date Noted  . Migraine with aura 11/13/2012  . FATIGUE 10/11/2009  . HYPERKALEMIA 12/02/2007  . DYSLIPIDEMIA 03/27/2007  . HYPERTENSION, WHITE COAT 07/04/2006  . ANXIETY 05/17/2006  . OSTEOPOROSIS 05/17/2006    Medications Prior to Visit Current Outpatient Prescriptions on File Prior to Visit  Medication Sig Dispense Refill  . calcium carbonate (OS-CAL) 1250 MG tablet Take 1 tablet by mouth 2 (two) times daily.        . fish oil-omega-3 fatty acids 1000 MG capsule Take 1 g by mouth 3 (three) times daily.        Marland Kitchen. LORazepam (ATIVAN) 1 MG tablet take 1/2 tablet by mouth twice a day if needed  30 tablet  0  . Multiple Vitamin (MULTIVITAMIN) tablet Take 1 tablet by mouth daily.        . Vitamin D, Ergocalciferol, (DRISDOL) 50000 UNITS CAPS Take 50,000 Units by mouth every 7 (seven) days.         No current facility-administered medications on file prior to visit.    Current Medications (verified) Current Outpatient Prescriptions  Medication Sig Dispense Refill  . calcium carbonate (OS-CAL) 1250 MG tablet Take 1 tablet by mouth 2 (two) times daily.        . fish oil-omega-3 fatty acids 1000 MG capsule Take 1 g by mouth 3 (three) times daily.        Marland Kitchen. LORazepam (ATIVAN) 1 MG tablet take 1/2 tablet by mouth twice a day if needed  30 tablet  0  . Multiple Vitamin (MULTIVITAMIN) tablet Take 1 tablet by mouth daily.        . Red Yeast Rice Extract (RED  YEAST RICE PO) Take by mouth.      . Vitamin D, Ergocalciferol, (DRISDOL) 50000 UNITS CAPS Take 50,000 Units by mouth every 7 (seven) days.         No current facility-administered medications for this visit.     Allergies (verified) Codeine; Epinephrine; Prednisone; and Sulfonamide derivatives   PAST HISTORY  Family History Family History  Problem Relation Age of Onset  . Heart disease Mother   . Diabetes Mother   . COPD Father     smoker  . Stroke Brother [id number]  . Parkinsonism Brother     Social History History  Substance Use Topics  . Smoking status: Never Smoker   . Smokeless tobacco: Not on file  . Alcohol Use: Yes     Are there smokers in your home (other than you)? No  Risk Factors Current exercise habits: walks 1-2 miles most days.   Dietary issues discussed: None   Cardiac risk factors: advanced age (older than 4755 for men, 6165 for women) and dyslipidemia.  Depression Screen (Note: if answer to either of the following is "Yes", a more complete depression screening is indicated)   Over the past two weeks, have you felt down, depressed or hopeless? No  Over the past two weeks, have you felt little  interest or pleasure in doing things? No  Have you lost interest or pleasure in daily life? No  Do you often feel hopeless? No  Do you cry easily over simple problems? No  Activities of Daily Living In your present state of health, do you have any difficulty performing the following activities?:  Driving? No Managing money?  No Feeding yourself? No Getting from bed to chair? No  Climbing a flight of stairs? No Preparing food and eating?: No Bathing or showering? No Getting dressed: No Getting to the toilet? No Using the toilet:No Moving around from place to place: No In the past year have you fallen or had a near fall?:No   Are you sexually active?  No  Do you have more than one partner?  No  Hearing Difficulties: No Do you often ask people to speak up or  repeat themselves? No Do you experience ringing or noises in your ears? No Do you have difficulty understanding soft or whispered voices? No   Do you feel that you have a problem with memory? No  Do you often misplace items? No  Do you feel safe at home?  Yes  Cognitive Testing  Alert? Yes  Normal Appearance?Yes  Oriented to person? yes  Place? Yes   Time? Yes  Recall of three objects?  Yes  Can perform simple calculations? Yes  Displays appropriate judgment?Yes  Can read the correct time from a watch face?Yes   6 CIT score of 0/28 (normal)   Advanced Directives have been discussed with the patient? Yes  List the Names of Other Physician/Practitioners you currently use: 1.  Dr. Junius Roads - dermatology 2. Dr. Jamey Reas - [place] 3.  Dr. Jackquline Bosch - optometrist  Indicate any recent Medical Services you may have received from other than [facility] providers in the past year (date may be approximate).  Immunization History  Administered Date(s) Administered  . Influenza Split 05/15/2012  . Influenza Whole 05/26/2007, 04/20/2008, 04/18/2009, 04/25/2010  . Influenza,inj,Quad PF,36+ Mos 05/15/2013, 05/17/2014  . Pneumococcal Polysaccharide-23 03/04/2003  . Td 02/20/2002  . Tdap 05/15/2012  . Zoster 04/25/2010    Screening Tests Health Maintenance  Topic Date Due  . Influenza Vaccine  02/20/2014  . Mammogram  09/16/2014  . Colonoscopy  05/15/2021  . Tetanus/tdap  05/15/2022  . Pneumococcal Polysaccharide Vaccine Age 94 And Over  Completed  . Zostavax  Completed    All answers were reviewed with the patient and necessary referrals were made:  METHENEY,CATHERINE, MD   05/17/2014   History reviewed: allergies, current medications, past family history, past medical history, past social history, past surgical history and problem list  Review of Systems A comprehensive review of systems was negative.    Objective:     Vision by Snellen chart: had eye exam with Dr.  Jackquline Bosch last month.   Body mass index is 25.22 kg/(m^2). BP 150/88  Pulse 96  Temp(Src) 97.7 F (36.5 C)  Ht 5\' 4"  (1.626 m)  Wt 147 lb (66.679 kg)  BMI 25.22 kg/m2  BP 150/88  Pulse 96  Temp(Src) 97.7 F (36.5 C)  Ht 5\' 4"  (1.626 m)  Wt 147 lb (66.679 kg)  BMI 25.22 kg/m2  General Appearance:    Alert, cooperative, no distress, appears stated age  Head:    Normocephalic, without obvious abnormality, atraumatic  Eyes:    PERRL, conjunctiva/corneas clear, EOM's intact,both eyes  Ears:    Normal TM's and external ear canals, both ears  Nose:  Nares normal, septum midline, mucosa normal, no drainage    or sinus tenderness  Throat:   Lips, mucosa, and tongue normal; teeth and gums normal  Neck:   Supple, symmetrical, trachea midline, no adenopathy;    thyroid:  no enlargement/tenderness/nodules; no carotid   bruit or JVD  Back:     Symmetric, no curvature, ROM normal, no CVA tenderness  Lungs:     Clear to auscultation bilaterally, respirations unlabored  Chest Wall:    No tenderness or deformity   Heart:    Regular rate and rhythm, S1 and S2 normal, no murmur, rub   or gallop  Breast Exam:    Not performed.   Abdomen:     Soft, non-tender, bowel sounds active all four quadrants,    no masses, no organomegaly  Genitalia:    Not performed.   Rectal:    Not performed.   Extremities:   Extremities normal, atraumatic, no cyanosis or edema  Pulses:   2+ and symmetric all extremities  Skin:   Skin color, texture, turgor normal, no rashes or lesions  Lymph nodes:   Cervical, supraclavicular arenormal  Neurologic:   CNII-XII intact, normal strength, sensation and reflexes    throughout       Assessment:     Medicare Wellness Exam       Plan:     During the course of the visit the patient was educated and counseled about appropriate screening and preventive services including:    Influenza vaccine  Prevnar 13 discussed.   Due for bone density. cna schedule in Feb  when due for mammogram.   Diet review for nutrition referral? Yes ____  Not Indicated __X_   Patient Instructions (the written plan) was given to the patient.  Medicare Attestation I have personally reviewed: The patient's medical and social history Their use of alcohol, tobacco or illicit drugs Their current medications and supplements The patient's functional ability including ADLs,fall risks, home safety risks, cognitive, and hearing and visual impairment Diet and physical activities Evidence for depression or mood disorders  The patient's weight, height, BMI, and visual acuity have been recorded in the chart.  I have made referrals, counseling, and provided education to the patient based on review of the above and I have provided the patient with a written personalized care plan for preventive services.     METHENEY,CATHERINE, MD   05/17/2014

## 2014-06-11 ENCOUNTER — Encounter: Payer: Self-pay | Admitting: Family Medicine

## 2014-06-11 ENCOUNTER — Other Ambulatory Visit: Payer: Self-pay | Admitting: Family Medicine

## 2014-06-14 ENCOUNTER — Encounter: Payer: Self-pay | Admitting: Family Medicine

## 2014-06-23 ENCOUNTER — Ambulatory Visit (INDEPENDENT_AMBULATORY_CARE_PROVIDER_SITE_OTHER): Payer: Medicare Other | Admitting: Family Medicine

## 2014-06-23 VITALS — Temp 98.0°F

## 2014-06-23 DIAGNOSIS — Z23 Encounter for immunization: Secondary | ICD-10-CM

## 2014-06-23 NOTE — Progress Notes (Signed)
   Subjective:    Patient ID: Kimberly Durham, female    DOB: 07/24/1935, 78 y.o.   MRN: [id number]  HPI  Markeria is here for a Prevnar injection.   Review of Systems     Objective:   Physical Exam        Assessment & Plan:  Patient tolerated injection well without complications.

## 2014-08-17 ENCOUNTER — Other Ambulatory Visit: Payer: Self-pay | Admitting: Family Medicine

## 2014-09-29 ENCOUNTER — Encounter: Payer: Self-pay | Admitting: Family Medicine

## 2014-10-12 ENCOUNTER — Other Ambulatory Visit: Payer: Self-pay | Admitting: Family Medicine

## 2014-11-09 ENCOUNTER — Telehealth: Payer: Self-pay

## 2014-11-09 ENCOUNTER — Other Ambulatory Visit: Payer: Self-pay | Admitting: Family Medicine

## 2014-11-09 DIAGNOSIS — E785 Hyperlipidemia, unspecified: Secondary | ICD-10-CM

## 2014-11-09 DIAGNOSIS — Z1329 Encounter for screening for other suspected endocrine disorder: Secondary | ICD-10-CM

## 2014-11-09 DIAGNOSIS — R030 Elevated blood-pressure reading, without diagnosis of hypertension: Secondary | ICD-10-CM

## 2014-11-09 DIAGNOSIS — R7989 Other specified abnormal findings of blood chemistry: Secondary | ICD-10-CM

## 2014-11-09 NOTE — Telephone Encounter (Signed)
Faxed labs downstairs.

## 2014-11-10 LAB — COMPLETE METABOLIC PANEL WITH GFR
ALBUMIN: 4.2 g/dL (ref 3.5–5.2)
ALT: 20 U/L (ref 0–35)
AST: 22 U/L (ref 0–37)
Alkaline Phosphatase: 52 U/L (ref 39–117)
BUN: 15 mg/dL (ref 6–23)
CHLORIDE: 99 meq/L (ref 96–112)
CO2: 22 mEq/L (ref 19–32)
Calcium: 9.7 mg/dL (ref 8.4–10.5)
Creat: 0.91 mg/dL (ref 0.50–1.10)
GFR, EST AFRICAN AMERICAN: 70 mL/min
GFR, EST NON AFRICAN AMERICAN: 61 mL/min
Glucose, Bld: 95 mg/dL (ref 70–99)
POTASSIUM: 4.4 meq/L (ref 3.5–5.3)
Sodium: 133 mEq/L — ABNORMAL LOW (ref 135–145)
Total Bilirubin: 0.9 mg/dL (ref 0.2–1.2)
Total Protein: 7.2 g/dL (ref 6.0–8.3)

## 2014-11-10 LAB — LIPID PANEL
Cholesterol: 247 mg/dL — ABNORMAL HIGH (ref 0–200)
HDL: 130 mg/dL (ref >=46)
LDL Cholesterol: 91 mg/dL (ref 0–99)
Total CHOL/HDL Ratio: 1.9 Ratio
Triglycerides: 129 mg/dL (ref <150)
VLDL: 26 mg/dL (ref 0–40)

## 2014-11-10 LAB — CBC WITH DIFFERENTIAL/PLATELET
Basophils Absolute: 0.1 10*3/uL (ref 0.0–0.1)
Basophils Relative: 1 % (ref 0–1)
EOS ABS: 0.3 10*3/uL (ref 0.0–0.7)
EOS PCT: 5 % (ref 0–5)
HCT: 41.3 % (ref 36.0–46.0)
Hemoglobin: 14.1 g/dL (ref 12.0–15.0)
Lymphocytes Relative: 38 % (ref 12–46)
Lymphs Abs: 2.5 10*3/uL (ref 0.7–4.0)
MCH: 31.3 pg (ref 26.0–34.0)
MCHC: 34.1 g/dL (ref 30.0–36.0)
MCV: 91.6 fL (ref 78.0–100.0)
MONOS PCT: 12 % (ref 3–12)
MPV: 9.9 fL (ref 8.6–12.4)
Monocytes Absolute: 0.8 10*3/uL (ref 0.1–1.0)
NEUTROS PCT: 44 % (ref 43–77)
Neutro Abs: 2.9 10*3/uL (ref 1.7–7.7)
PLATELETS: 368 10*3/uL (ref 150–400)
RBC: 4.51 MIL/uL (ref 3.87–5.11)
RDW: 13.6 % (ref 11.5–15.5)
WBC: 6.6 10*3/uL (ref 4.0–10.5)

## 2014-11-10 LAB — TSH: TSH: 4.538 u[IU]/mL — AB (ref 0.350–4.500)

## 2014-11-12 LAB — T4, FREE: Free T4: 1 ng/dL (ref 0.80–1.80)

## 2014-11-15 ENCOUNTER — Other Ambulatory Visit: Payer: Self-pay | Admitting: *Deleted

## 2014-11-16 ENCOUNTER — Encounter: Payer: Self-pay | Admitting: Family Medicine

## 2014-11-16 ENCOUNTER — Ambulatory Visit (INDEPENDENT_AMBULATORY_CARE_PROVIDER_SITE_OTHER): Payer: Medicare Other | Admitting: Family Medicine

## 2014-11-16 VITALS — BP 140/80 | HR 95 | Wt 146.0 lb

## 2014-11-16 DIAGNOSIS — E871 Hypo-osmolality and hyponatremia: Secondary | ICD-10-CM | POA: Diagnosis not present

## 2014-11-16 DIAGNOSIS — I10 Essential (primary) hypertension: Secondary | ICD-10-CM

## 2014-11-16 DIAGNOSIS — F411 Generalized anxiety disorder: Secondary | ICD-10-CM

## 2014-11-16 NOTE — Progress Notes (Signed)
   Subjective:    Patient ID: Kimberly Durham, female    DOB: [birth year], 79 y.o.   MRN: [id number]  HPI Hypertension- Pt denies chest pain, SOB, dizziness, or heart palpitations.  Taking meds as directed w/o problems.  Denies medication side effects.  Home blood pressures are well controlled. She brought in a log from March 6 2 today. Most all of her pressures systolic are in the 120s and diastolics are in the 60s and 50s. Pulse is predominantly in the low 60s.  Hyponatremia - Says drank 2 big bottles in of water right before had her labs done.  She has not had any symptoms of hyponatremia.  Anxiety - Walking 1.5 to 2 miles per day. He is oriented and using the morning and then all the. Takes half a xanax most days but occ skips.     Review of Systems     Objective:   Physical Exam  Constitutional: She is oriented to person, place, and time. She appears well-developed and well-nourished.  HENT:  Head: Normocephalic and atraumatic.  Cardiovascular: Normal rate, regular rhythm and normal heart sounds.   Pulmonary/Chest: Effort normal and breath sounds normal.  Neurological: She is alert and oriented to person, place, and time.  Skin: Skin is warm and dry.  Psychiatric: She has a normal mood and affect. Her behavior is normal.          Assessment & Plan:  Hypertension-well-controlled at home. She does have whitecoat hypertension. Continue current regimen. Next  Hyponatremia-due to recheck sodium level.   Anxiety-we discussed trying to decrease her use of her benzodiazepine. Warned about new studies that show increased risk of dementia and falls and encouraged her to start using a little bit more sparingly rather than daily. She seems to think that helps with her anxiety and her headaches.

## 2014-11-17 ENCOUNTER — Other Ambulatory Visit: Payer: Self-pay | Admitting: *Deleted

## 2014-11-17 DIAGNOSIS — R7989 Other specified abnormal findings of blood chemistry: Secondary | ICD-10-CM

## 2014-12-01 LAB — BASIC METABOLIC PANEL WITH GFR
BUN: 18 mg/dL (ref 6–23)
CO2: 26 meq/L (ref 19–32)
Calcium: 9.4 mg/dL (ref 8.4–10.5)
Chloride: 101 mEq/L (ref 96–112)
Creat: 0.95 mg/dL (ref 0.50–1.10)
GFR, Est African American: 67 mL/min
GFR, Est Non African American: 58 mL/min — ABNORMAL LOW
Glucose, Bld: 90 mg/dL (ref 70–99)
Potassium: 4.4 mEq/L (ref 3.5–5.3)
SODIUM: 136 meq/L (ref 135–145)

## 2014-12-01 NOTE — Progress Notes (Signed)
Quick Note:  All labs are normal. ______ 

## 2014-12-21 ENCOUNTER — Other Ambulatory Visit: Payer: Self-pay | Admitting: Family Medicine

## 2015-03-02 ENCOUNTER — Other Ambulatory Visit: Payer: Self-pay | Admitting: Family Medicine

## 2015-05-05 ENCOUNTER — Other Ambulatory Visit: Payer: Self-pay | Admitting: Family Medicine

## 2015-05-05 MED ORDER — LORAZEPAM 1 MG PO TABS: 0.5000 mg | ORAL_TABLET | Freq: Two times a day (BID) | ORAL | Status: DC | PRN

## 2015-05-11 ENCOUNTER — Encounter: Payer: Self-pay | Admitting: Family Medicine

## 2015-05-11 DIAGNOSIS — R030 Elevated blood-pressure reading, without diagnosis of hypertension: Secondary | ICD-10-CM

## 2015-05-11 DIAGNOSIS — E875 Hyperkalemia: Secondary | ICD-10-CM

## 2015-05-11 DIAGNOSIS — Z1329 Encounter for screening for other suspected endocrine disorder: Secondary | ICD-10-CM

## 2015-05-11 DIAGNOSIS — E785 Hyperlipidemia, unspecified: Secondary | ICD-10-CM

## 2015-05-12 LAB — COMPLETE METABOLIC PANEL WITH GFR
ALBUMIN: 4.4 g/dL (ref 3.6–5.1)
ALK PHOS: 63 U/L (ref 33–130)
ALT: 17 U/L (ref 6–29)
AST: 20 U/L (ref 10–35)
BILIRUBIN TOTAL: 0.8 mg/dL (ref 0.2–1.2)
BUN: 16 mg/dL (ref 7–25)
CO2: 26 mmol/L (ref 20–31)
CREATININE: 0.89 mg/dL (ref 0.60–0.93)
Calcium: 9.4 mg/dL (ref 8.6–10.4)
Chloride: 101 mmol/L (ref 98–110)
GFR, EST AFRICAN AMERICAN: 72 mL/min (ref >=60)
GFR, Est Non African American: 63 mL/min (ref >=60)
Glucose, Bld: 94 mg/dL (ref 65–99)
Potassium: 4.3 mmol/L (ref 3.5–5.3)
Sodium: 137 mmol/L (ref 135–146)
TOTAL PROTEIN: 7.2 g/dL (ref 6.1–8.1)

## 2015-05-12 LAB — LIPID PANEL
CHOLESTEROL: 248 mg/dL — AB (ref 125–200)
HDL: 113 mg/dL (ref >=46)
LDL Cholesterol: 114 mg/dL (ref <130)
Total CHOL/HDL Ratio: 2.2 Ratio (ref <=5.0)
Triglycerides: 103 mg/dL (ref <150)
VLDL: 21 mg/dL (ref <30)

## 2015-05-12 LAB — T4, FREE: FREE T4: 1.05 ng/dL (ref 0.80–1.80)

## 2015-05-12 LAB — T3, FREE: T3, Free: 2.6 pg/mL (ref 2.3–4.2)

## 2015-05-12 LAB — TSH: TSH: 3.721 u[IU]/mL (ref 0.350–4.500)

## 2015-05-18 ENCOUNTER — Encounter: Payer: Self-pay | Admitting: Family Medicine

## 2015-05-18 ENCOUNTER — Ambulatory Visit (INDEPENDENT_AMBULATORY_CARE_PROVIDER_SITE_OTHER): Payer: Medicare Other | Admitting: Family Medicine

## 2015-05-18 VITALS — BP 154/84 | HR 64 | Temp 98.0°F | Resp 16 | Wt 150.5 lb

## 2015-05-18 DIAGNOSIS — Z23 Encounter for immunization: Secondary | ICD-10-CM

## 2015-05-18 DIAGNOSIS — N1832 Chronic kidney disease, stage 3b: Secondary | ICD-10-CM | POA: Insufficient documentation

## 2015-05-18 DIAGNOSIS — N183 Chronic kidney disease, stage 3 unspecified: Secondary | ICD-10-CM

## 2015-05-18 DIAGNOSIS — Z0000 Encounter for general adult medical examination without abnormal findings: Secondary | ICD-10-CM

## 2015-05-18 LAB — POCT URINALYSIS DIPSTICK
BILIRUBIN UA: NEGATIVE
GLUCOSE UA: NEGATIVE
Ketones, UA: NEGATIVE
Nitrite, UA: NEGATIVE
Protein, UA: NEGATIVE
RBC UA: NEGATIVE
SPEC GRAV UA: 1.015
Urobilinogen, UA: 0.2
pH, UA: 7.5

## 2015-05-18 NOTE — Patient Instructions (Addendum)
Remember to get us a copy of year living will and or advanced directives if you think about it. Keep up the great job with diet and exercise.  Keep up a regular exercise program and make sure you are eating a healthy diet Try to eat 4 servings of dairy a day, or if you are lactose intolerant take a calcium with vitamin D daily.  Your vaccines are up to date.

## 2015-05-18 NOTE — Progress Notes (Signed)
Subjective:    Kimberly Durham is a 79 y.o. female who presents for Medicare Annual/Subsequent preventive examination.  Preventive Screening-Counseling & Management  Tobacco History  Smoking status  . Never Smoker   Smokeless tobacco  . Not on file     Problems Prior to Visit 1. She has lost several family members this year so that has been hard on her.    Current Problems (verified) Patient Active Problem List   Diagnosis Date Noted  . CKD (chronic kidney disease) stage 3, GFR 30-59 ml/min 05/18/2015  . Migraine with aura 11/13/2012  . FATIGUE 10/11/2009  . HYPERKALEMIA 12/02/2007  . Dyslipidemia 03/27/2007  . HYPERTENSION, WHITE COAT 07/04/2006  . Anxiety state 05/17/2006  . OSTEOPOROSIS 05/17/2006    Medications Prior to Visit Current Outpatient Prescriptions on File Prior to Visit  Medication Sig Dispense Refill  . calcium carbonate (OS-CAL) 1250 MG tablet Take 1 tablet by mouth 2 (two) times daily.      . fish oil-omega-3 fatty acids 1000 MG capsule Take 1 g by mouth 3 (three) times daily.      Marland Kitchen. LORazepam (ATIVAN) 1 MG tablet Take 0.5 tablets (0.5 mg total) by mouth 2 (two) times daily as needed for anxiety. 30 tablet 0  . Multiple Vitamin (MULTIVITAMIN) tablet Take 1 tablet by mouth daily.      . Red Yeast Rice Extract (RED YEAST RICE PO) Take by mouth.    . Vitamin D, Ergocalciferol, (DRISDOL) 50000 UNITS CAPS Take 50,000 Units by mouth every 7 (seven) days.       No current facility-administered medications on file prior to visit.    Current Medications (verified) Current Outpatient Prescriptions  Medication Sig Dispense Refill  . calcium carbonate (OS-CAL) 1250 MG tablet Take 1 tablet by mouth 2 (two) times daily.      . fish oil-omega-3 fatty acids 1000 MG capsule Take 1 g by mouth 3 (three) times daily.      Marland Kitchen. LORazepam (ATIVAN) 1 MG tablet Take 0.5 tablets (0.5 mg total) by mouth 2 (two) times daily as needed for anxiety. 30 tablet 0  . Multiple Vitamin  (MULTIVITAMIN) tablet Take 1 tablet by mouth daily.      . Red Yeast Rice Extract (RED YEAST RICE PO) Take by mouth.    . Vitamin D, Ergocalciferol, (DRISDOL) 50000 UNITS CAPS Take 50,000 Units by mouth every 7 (seven) days.       No current facility-administered medications for this visit.     Allergies (verified) Codeine; Epinephrine; Prednisone; and Sulfonamide derivatives   PAST HISTORY  Family History Family History  Problem Relation Age of Onset  . Heart disease Mother   . Diabetes Mother   . COPD Father     smoker  . Stroke Brother [id number]  . Parkinsonism Brother     Social History Social History  Substance Use Topics  . Smoking status: Never Smoker   . Smokeless tobacco: Not on file  . Alcohol Use: Yes     Are there smokers in your home (other than you)? No  Risk Factors Current exercise habits: walks regulraly  Dietary issues discussed: None   Cardiac risk factors: advanced age (older than 4755 for men, 1765 for women).  Depression Screen (Note: if answer to either of the following is "Yes", a more complete depression screening is indicated)   Over the past two weeks, have you felt down, depressed or hopeless? No  Over the past two weeks, have you felt little interest  or pleasure in doing things? No  Have you lost interest or pleasure in daily life? No  Do you often feel hopeless? No  Do you cry easily over simple problems? No  Activities of Daily Living In your present state of health, do you have any difficulty performing the following activities?:  Driving? No Managing money?  No Feeding yourself? No Getting from bed to chair? No   Climbing a flight of stairs? No Preparing food and eating?: No Bathing or showering? No Getting dressed: No Getting to the toilet? No Using the toilet:No Moving around from place to place: No In the past year have you fallen or had a near fall?:No    Hearing Difficulties: No Do you often ask people to speak up or repeat  themselves? No Do you experience ringing or noises in your ears? No Do you have difficulty understanding soft or whispered voices? No   Do you feel that you have a problem with memory? No  Do you often misplace items? No  Do you feel safe at home?  Yes  Cognitive Testing  Alert? Yes  Normal Appearance?Yes  Oriented to person? Yes  Place? Yes   Time? Yes   Advanced Directives have been discussed with the patient? Yes  List the Names of Other Physician/Practitioners you currently use: 1.    Indicate any recent Medical Services you may have received from other than [facility] providers in the past year (date may be approximate).  Immunization History  Administered Date(s) Administered  . Influenza Split 05/15/2012  . Influenza Whole 05/26/2007, 04/20/2008, 04/18/2009, 04/25/2010  . Influenza,inj,Quad PF,36+ Mos 05/15/2013, 05/17/2014, 05/18/2015  . Pneumococcal Conjugate-13 06/23/2014  . Pneumococcal Polysaccharide-23 03/04/2003  . Td 02/20/2002  . Tdap 05/15/2012  . Zoster 04/25/2010    Screening Tests Health Maintenance  Topic Date Due  . MAMMOGRAM  09/18/2015  . INFLUENZA VACCINE  02/21/2016  . TETANUS/TDAP  05/15/2022  . DEXA SCAN  Completed  . ZOSTAVAX  Completed  . PNA vac Low Risk Adult  Completed    All answers were reviewed with the patient and necessary referrals were made:  METHENEY,CATHERINE, MD   05/18/2015   History reviewed: allergies, current medications, past family history, past medical history, past social history, past surgical history and problem list  Review of Systems Pertinent items noted in HPI and remainder of comprehensive ROS otherwise negative.    Objective:     Vision by Snellen chart: eye exam scheduled for tomorrow.   Body mass index is 25.82 kg/(m^2). BP 180/80 mmHg  Pulse 64  Temp(Src) 98 F (36.7 C) (Oral)  Resp 16  Wt 150 lb 8 oz (68.266 kg)  SpO2 100%  BP 180/80 mmHg  Pulse 64  Temp(Src) 98 F (36.7 C) (Oral)  Resp 16   Wt 150 lb 8 oz (68.266 kg)  SpO2 100% General appearance: alert, cooperative and appears stated age Head: Normocephalic, without obvious abnormality, atraumatic Eyes: conj clear, EOMI, pEERLA Ears: normal TM's and external ear canals both ears Nose: Nares normal. Septum midline. Mucosa normal. No drainage or sinus tenderness. Throat: lips, mucosa, and tongue normal; teeth and gums normal Neck: no adenopathy, no carotid bruit, no JVD, supple, symmetrical, trachea midline and thyroid not enlarged, symmetric, no tenderness/mass/nodules Back: symmetric, no curvature. ROM normal. No CVA tenderness. Lungs: clear to auscultation bilaterally Heart: regular rate and rhythm, S1, S2 normal, no murmur, click, rub or gallop Abdomen: soft, non-tender; bowel sounds normal; no masses,  no organomegaly Extremities: extremities normal,  atraumatic, no cyanosis or edema Pulses: 2+ and symmetric Skin: Skin color, texture, turgor normal. No rashes or lesions Lymph nodes: Cervical, supraclavicular, and axillary nodes normal. Neurologic: Alert and oriented X 3, normal strength and tone. Normal symmetric reflexes. Normal coordination and gait     Assessment:     Medicare Wellness Exam       Plan:     During the course of the visit the patient was educated and counseled about appropriate screening and preventive services including:    Influenza vaccine - given today   Home BPs well controled. Brought in log form Mostly running in the 120-130s.   CKD 3 - discussed her kidney function today.  Will check urine microalbumin.   Reminded to get [place] a copy of her advanced directives.   Diet review for nutrition referral? Yes ____  Not Indicated __X__   Patient Instructions (the written plan) was given to the patient.  Medicare Attestation I have personally reviewed: The patient's medical and social history Their use of alcohol, tobacco or illicit drugs Their current medications and supplements The  patient's functional ability including ADLs,fall risks, home safety risks, cognitive, and hearing and visual impairment Diet and physical activities Evidence for depression or mood disorders  The patient's weight, height, BMI, and visual acuity have been recorded in the chart.  I have made referrals, counseling, and provided education to the patient based on review of the above and I have provided the patient with a written personalized care plan for preventive services.     METHENEY,CATHERINE, MD   05/18/2015

## 2015-07-04 ENCOUNTER — Other Ambulatory Visit: Payer: Self-pay | Admitting: Family Medicine

## 2015-07-04 MED ORDER — LORAZEPAM 1 MG PO TABS: 0.5000 mg | ORAL_TABLET | Freq: Two times a day (BID) | ORAL | Status: DC | PRN

## 2015-07-04 NOTE — Addendum Note (Signed)
Addended by: Pixie CasinoUNNINGHAM, Maham Quintin C on: 07/04/2015 03:37 PM   Modules accepted: Orders

## 2015-09-01 ENCOUNTER — Ambulatory Visit (INDEPENDENT_AMBULATORY_CARE_PROVIDER_SITE_OTHER): Payer: Medicare Other | Admitting: Family Medicine

## 2015-09-01 ENCOUNTER — Encounter: Payer: Self-pay | Admitting: Family Medicine

## 2015-09-01 VITALS — BP 199/91 | HR 67 | Temp 98.1°F | Wt 148.9 lb

## 2015-09-01 DIAGNOSIS — F411 Generalized anxiety disorder: Secondary | ICD-10-CM

## 2015-09-01 DIAGNOSIS — J0100 Acute maxillary sinusitis, unspecified: Secondary | ICD-10-CM | POA: Diagnosis not present

## 2015-09-01 MED ORDER — LORAZEPAM 1 MG PO TABS: 0.5000 mg | ORAL_TABLET | Freq: Two times a day (BID) | ORAL | Status: DC | PRN

## 2015-09-01 MED ORDER — AMOXICILLIN-POT CLAVULANATE 875-125 MG PO TABS: 1.0000 | ORAL_TABLET | Freq: Two times a day (BID) | ORAL | Status: DC

## 2015-09-01 NOTE — Patient Instructions (Signed)

## 2015-09-01 NOTE — Progress Notes (Signed)
   Subjective:    Patient ID: Kimberly Durham, female    DOB: 1935/10/29, 80 y.o.   MRN: [id number]  HPI  sxs x 1 mo she has facial pain,pressure, unusual smell/odor, yellow nasal drainage. no fever. Has had ilateral cheek pain and teeth pain.   F/u anxiety- needs refill on Ativan. She takes 1/2 tab daily.     Review of Systems     Objective:   Physical Exam  Constitutional: She is oriented to person, place, and time. She appears well-developed and well-nourished.  HENT:  Head: Normocephalic and atraumatic.  Right Ear: External ear normal.  Left Ear: External ear normal.  Nose: Nose normal.  Mouth/Throat: Oropharynx is clear and moist.  TMs and canals are clear.   Eyes: Conjunctivae and EOM are normal. Pupils are equal, round, and reactive to light.  Neck: Neck supple. No thyromegaly present.  Cardiovascular: Normal rate, regular rhythm and normal heart sounds.   Pulmonary/Chest: Effort normal and breath sounds normal. She has no wheezes.  Lymphadenopathy:    She has no cervical adenopathy.  Neurological: She is alert and oriented to person, place, and time.  Skin: Skin is warm and dry.  Psychiatric: She has a normal mood and affect.          Assessment & Plan:  Acute sinusitis -   Anxiety - discussed using her ativan sparingly and trying to use less often.

## 2015-09-21 LAB — HM MAMMOGRAPHY

## 2015-09-22 ENCOUNTER — Encounter: Payer: Self-pay | Admitting: Family Medicine

## 2015-09-23 LAB — HM MAMMOGRAPHY

## 2015-09-30 ENCOUNTER — Encounter: Payer: Self-pay | Admitting: *Deleted

## 2015-10-02 ENCOUNTER — Encounter (HOSPITAL_BASED_OUTPATIENT_CLINIC_OR_DEPARTMENT_OTHER): Payer: Self-pay | Admitting: Emergency Medicine

## 2015-10-02 ENCOUNTER — Emergency Department (HOSPITAL_BASED_OUTPATIENT_CLINIC_OR_DEPARTMENT_OTHER): Payer: Medicare Other

## 2015-10-02 ENCOUNTER — Emergency Department (HOSPITAL_BASED_OUTPATIENT_CLINIC_OR_DEPARTMENT_OTHER)
Admission: EM | Admit: 2015-10-02 | Discharge: 2015-10-02 | Disposition: A | Discharge status: Home / Self Care | Payer: Medicare Other | Attending: Emergency Medicine | Admitting: Emergency Medicine

## 2015-10-02 DIAGNOSIS — Z79899 Other long term (current) drug therapy: Secondary | ICD-10-CM | POA: Insufficient documentation

## 2015-10-02 DIAGNOSIS — F419 Anxiety disorder, unspecified: Secondary | ICD-10-CM | POA: Diagnosis not present

## 2015-10-02 DIAGNOSIS — R17 Unspecified jaundice: Secondary | ICD-10-CM | POA: Diagnosis not present

## 2015-10-02 DIAGNOSIS — Z880 Allergy status to penicillin: Secondary | ICD-10-CM | POA: Diagnosis not present

## 2015-10-02 DIAGNOSIS — Z8679 Personal history of other diseases of the circulatory system: Secondary | ICD-10-CM | POA: Diagnosis not present

## 2015-10-02 DIAGNOSIS — M810 Age-related osteoporosis without current pathological fracture: Secondary | ICD-10-CM | POA: Insufficient documentation

## 2015-10-02 DIAGNOSIS — R748 Abnormal levels of other serum enzymes: Secondary | ICD-10-CM | POA: Insufficient documentation

## 2015-10-02 DIAGNOSIS — K5900 Constipation, unspecified: Secondary | ICD-10-CM | POA: Diagnosis present

## 2015-10-02 DIAGNOSIS — E785 Hyperlipidemia, unspecified: Secondary | ICD-10-CM | POA: Diagnosis not present

## 2015-10-02 LAB — URINALYSIS, ROUTINE W REFLEX MICROSCOPIC
GLUCOSE, UA: NEGATIVE mg/dL
Hgb urine dipstick: NEGATIVE
Ketones, ur: NEGATIVE mg/dL
LEUKOCYTES UA: NEGATIVE
Nitrite: NEGATIVE
PROTEIN: NEGATIVE mg/dL
Specific Gravity, Urine: 1.008 (ref 1.005–1.030)
pH: 6 (ref 5.0–8.0)

## 2015-10-02 LAB — CBC WITH DIFFERENTIAL/PLATELET
Basophils Absolute: 0.1 10*3/uL (ref 0.0–0.1)
Basophils Relative: 1 %
EOS PCT: 7 %
Eosinophils Absolute: 0.7 10*3/uL (ref 0.0–0.7)
HCT: 36.8 % (ref 36.0–46.0)
Hemoglobin: 13 g/dL (ref 12.0–15.0)
LYMPHS ABS: 2.3 10*3/uL (ref 0.7–4.0)
LYMPHS PCT: 24 %
MCH: 31.1 pg (ref 26.0–34.0)
MCHC: 35.3 g/dL (ref 30.0–36.0)
MCV: 88 fL (ref 78.0–100.0)
MONO ABS: 1.4 10*3/uL — AB (ref 0.1–1.0)
Monocytes Relative: 14 %
Neutro Abs: 5.1 10*3/uL (ref 1.7–7.7)
Neutrophils Relative %: 54 %
PLATELETS: 308 10*3/uL (ref 150–400)
RBC: 4.18 MIL/uL (ref 3.87–5.11)
RDW: 15 % (ref 11.5–15.5)
WBC: 9.5 10*3/uL (ref 4.0–10.5)

## 2015-10-02 LAB — COMPREHENSIVE METABOLIC PANEL
ALBUMIN: 3.9 g/dL (ref 3.5–5.0)
ALT: 558 U/L — AB (ref 14–54)
AST: 268 U/L — AB (ref 15–41)
Alkaline Phosphatase: 601 U/L — ABNORMAL HIGH (ref 38–126)
Anion gap: 9 (ref 5–15)
BUN: 22 mg/dL — AB (ref 6–20)
CHLORIDE: 101 mmol/L (ref 101–111)
CO2: 22 mmol/L (ref 22–32)
CREATININE: 0.67 mg/dL (ref 0.44–1.00)
Calcium: 8.7 mg/dL — ABNORMAL LOW (ref 8.9–10.3)
GFR calc Af Amer: >60 mL/min (ref >60)
GLUCOSE: 141 mg/dL — AB (ref 65–99)
Potassium: 4.2 mmol/L (ref 3.5–5.1)
SODIUM: 132 mmol/L — AB (ref 135–145)
Total Bilirubin: 8 mg/dL — ABNORMAL HIGH (ref 0.3–1.2)
Total Protein: 7.4 g/dL (ref 6.5–8.1)

## 2015-10-02 LAB — LIPASE, BLOOD: Lipase: 24 U/L (ref 11–51)

## 2015-10-02 LAB — PROTIME-INR
INR: 0.92 (ref 0.00–1.49)
Prothrombin Time: 12.6 seconds (ref 11.6–15.2)

## 2015-10-02 MED ORDER — IOHEXOL 300 MG/ML  SOLN
25.0000 mL | Freq: Once | INTRAMUSCULAR | Status: AC | PRN
  Administered 2015-10-02: 25 mL via ORAL

## 2015-10-02 MED ORDER — LORAZEPAM 1 MG PO TABS
0.5000 mg | ORAL_TABLET | Freq: Once | ORAL | Status: AC
  Administered 2015-10-02: 0.5 mg via ORAL
  Filled 2015-10-02: qty 1

## 2015-10-02 MED ORDER — IOHEXOL 300 MG/ML  SOLN
100.0000 mL | Freq: Once | INTRAMUSCULAR | Status: AC | PRN
  Administered 2015-10-02: 100 mL via INTRAVENOUS

## 2015-10-02 NOTE — Discharge Instructions (Signed)
Jaundice, Adult Jaundice is a yellowish discoloration of the skin, the whites of the eyes, and mucous membranes. Jaundice can be a sign that the liver or the bile system is not working normally. CAUSES This condition is caused by an increased level of bilirubin in the blood. Bilirubin is a substance that is produced by the normal breakdown of red blood cells. Conditions and activities that can cause an increase in the bilirubin level include:  Viral hepatitis.  Gallstones or other conditions, such as a tumor, that can cause a blockage of bile ducts.  Excessive use of alcohol.  Other liver diseases, such as cirrhosis.  Certain cancers.  Certain infections.  Certain genetic syndromes.  Certain medicines. SYMPTOMS Symptoms of this condition include:  Yellow color to the skin, the whites of the eyes, or mucous membranes.  Dark brown urine.  Stomach pain.  Light or clay-colored stool.  Itchy skin (pruritus). DIAGNOSIS This condition is diagnosed with a medical history, physical exam, and blood tests. You may have additional tests to determine what is causing your bilirubin level to increase. TREATMENT Treatment for jaundice depends on the underlying condition. Treatment may include:  Stopping the use of a certain medicine.  Fluids that are given through an IV tube that is inserted into one of your veins.  Medicines to treat pruritus.  Surgery, if there is blockage of the bile ducts. HOME CARE INSTRUCTIONS  Drink enough fluid to keep your urine clear or pale yellow.  Do not drink alcohol.  Take medicines only as directed by your health care provider.  Keep all follow-up visits as directed by your health care provider. This is important.  You may use skin lotion to relieve itching. SEEK MEDICAL CARE IF:  You have a fever. SEEK IMMEDIATE MEDICAL CARE IF:  Your symptoms suddenly get worse.  You have symptoms for more than 72 hours.  Your pain gets worse.  You  vomit repeatedly.  You become weak or confused.  You develop a severe headache.  You become severely dehydrated. Signs of severe dehydration include:  A very dry mouth.  A rapid, weak pulse.  Rapid breathing.  Blue lips.   This information is not intended to replace advice given to you by your health care provider. Make sure you discuss any questions you have with your health care provider.   Document Released: 07/09/2005 Document Revised: 11/23/2014 Document Reviewed: 07/05/2014 Elsevier Interactive Patient Education 2016 [employer]  Liver Function Tests Liver function tests are blood tests to see how well your liver is working. The proteins and enzymes measured in the test can alert your health care provider to inflammation, damage, or disease in your liver. It is common to have liver function tests:  During annual physical exams.  When you are taking certain medicines.  If you have liver disease.  If you drink a lot of alcohol.  When you are not feeling well.  When you have other conditions that may affect the liver. Substances measured may include:   Alanine transaminase (ALT). This is an enzyme in the liver.  Aspartate transaminase (AST). This is an enzyme in the liver, heart, and muscles.  Alkaline phosphatase (ALP). This is a protein in the liver, bile ducts, and bone. It is also in other body tissues.  Total bilirubin. This is a yellow pigment in bile.  Albumin. This is a protein in the liver.  Prothrombin time and international normalized ratio (PT and INR). PT measures the time that it takes for  your blood to clot. INR is a calculation of blood clotting time based upon your PT result. It is also calculated based on normal ranges defined by the laboratory that processed your lab test.  Total protein. This measures two proteins, albumin and globulin, found in the blood. PREPARATION FOR TEST How you prepare will depend on which tests are being done and  the reason why these tests are being done. You may need to:  Avoid eating for 4-6 hours before the test or as directed by your health care provider.  Stop taking certain medicines prior to your blood test as directed by your health care provider. RESULTS  It is your responsibility to obtain your test results. Ask the lab or department performing the test when and how you will get your results. Contact your health care provider to discuss any questions you have about your results. RANGE OF NORMAL VALUES Ranges for normal values may vary among different labs and hospitals. You should always check with your health care provider after having lab work or other tests done to discuss the meaning of your test results and whether your values are considered within normal limits. The following are normal ranges for substances measured in liver function tests: ALT  Infant: may be twice as high as adult values.  Child or adult: 4-36 international units/L at 37C or 4-36 units/L (SI units).  Elderly: may be slightly higher than adult values. AST  Newborn 79-60 days old: 35-140 units/L.  Child under 36 years old: 15-60 units/L.  49-45 years old: 15-50 units/L.  30-64 years old: 10-50 units/L.  8-24 years old: 10-40 units/L.  Adult: 0-35 units/L or 0-0.58 microkatal/L (SI units).  Elderly: slightly higher than adults. ALP  Child under 69 years old: 85-235 units/L.  37-92 years old: 65-210 units/L.  55-29 years old: 60-300 units/L.  11-54 years old: 30-200 units/L.  Adult: 30-120 units/L or 0.5-2.0 microkatal/L (SI units).  Elderly: slightly higher than adult. Total bilirubin  Newborn: 1.0-12.0 mg/dL or 16.1-096 micromoles/L (SI units).  Adult, elderly, or child: 0.3-1.0 mg/dL or 0.4-54 micromoles/L. Albumin  Premature infant: 3.0-4.2 g/dL.  Newborn: 3.5-5.4 g/dL.  Infant: 4.4-5.4 g/dL.  Child: 4.0-5.9 g/dL.  Adult or elderly: 3.5-5.0 g/dL or 09-81 g/L (SI units). PT  11.0-12.5  seconds; 85%-100%. INR  0.8-1.1. Total protein  Premature infant: 4.2-7.6 g/dL.  Newborn: 4.6-7.4 g/dL.  Infant: 6.0-6.7 g/dL.  Child: 6.2-8.0 g/dL.  Adult or elderly: 6.4-8.3 g/dL or 19-14 g/L (SI units). MEANING OF RESULTS OUTSIDE NORMAL VALUE RANGES Sometimes test results can be abnormal due to other factors, such as medicines, exercise, or pregnancy. Follow up with your health care provider if you have any questions about test results outside the normal value ranges. ALT  Levels above the normal range, along with other test results, may indicate liver disease. AST  Levels above the normal range, along with other test results, may indicate liver disease. Sometimes levels also increase after burns, surgery, heart attack, muscle damage, or seizure. ALP  Levels above the normal range, along with other test results, may indicate biliary obstruction, diseases of the liver, bone disease, thyroid disease, tumors, fractures, leukemia or lymphoma, or several other conditions. People with blood type O or B may show higher levels after a fatty meal.  Levels below the normal range, along with other test results, may indicate bone and teeth conditions, malnutrition, protein deficiency, or Wilson disease. Total bilirubin  Levels above the normal range, along with other test results, may indicate  problems with the liver, gallbladder, or bile ducts. Albumin  Levels above the normal range, along with other test results, may indicate dehydration. They may also be caused by a diet that is high in protein. Sometimes, the band placed around the upper arm during the process of drawing blood can cause the level of this protein in your blood to rise and give you a result above the normal range.  Levels below the normal range, along with other tests results, may indicate kidney disease, liver disease, or malabsorption of nutrients. PT and INR  Levels above the normal range mean your blood is  clotting slower than normal. This may be due to blood disorders, liver disorders, or low levels of vitamin K. Total protein  Levels above the normal range, along with other test results, may be due to infection or other diseases.  Levels below the normal range, along with other test results, may be due to an immune system disorder, bleeding, burns, kidney disorder, liver disease, trouble absorbing or getting enough nutrients, or other conditions that affect the intestines.   This information is not intended to replace advice given to you by your health care provider. Make sure you discuss any questions you have with your health care provider.   Document Released: 08/11/2004 Document Revised: 07/30/2014 Document Reviewed: 11/12/2013 Elsevier Interactive Patient Education [employer]

## 2015-10-02 NOTE — ED Notes (Signed)
Assumed care of patient from Joss, Charity fundraiserN. Pt anxious related to diagnosis of elevated LFT. Requesting Ativan PO per home meds - notified PA, Verbal order provided. Awaiting response of consult from GI.

## 2015-10-02 NOTE — ED Provider Notes (Signed)
CSN: [id number]     Arrival date & time 10/02/15  1539 History   First MD Initiated Contact with Patient 10/02/15 1836     Chief Complaint  Patient presents with  . Constipation     (Consider location/radiation/quality/duration/timing/severity/associated sxs/prior Treatment) HPI  Kimberly Durham is a(n) 80 y.o. female who presents to the ED for jaundice. The patient states that she noticed skin yellowing and dark urineover the past few days. She saw an esthetician yesterday who commented on how yellow her eyes appeared. She spoke with nurse triage at her [facility] office who advised her to come to the ED. She states that she is otherwise in very good health and exrecises daily. She states "I am very health conscious." SHe recently finished a course of Augmentin. She states that she cannot take amoxicillin or pencillin because the last time she did she became jaunsided and she did not know that Augmentin had amoxicilln. She denies any abdominal pain. She denis any alcohol or tylenol use. She denies fevers, nightsweats, unexplained weight loss. She denies any symptoms of viral prodrome. She states that the color of her urine is improving.  Past Medical History  Diagnosis Date  . Hemorrhoids   . Migraines   . Anxiety   . Osteoporosis   . White coat hypertension   . Dyslipidemia    Past Surgical History  Procedure Laterality Date  . Abdominal hysterectomy    . Oophorectomy    . Flexible sigmoidoscopy  08/2002    negative   Family History  Problem Relation Age of Onset  . Heart disease Mother   . Diabetes Mother   . COPD Father     smoker  . Stroke Brother [id number]  . Parkinsonism Brother    Social History  Substance Use Topics  . Smoking status: Never Smoker   . Smokeless tobacco: None  . Alcohol Use: Yes   OB History    No data available     Review of Systems  Ten systems reviewed and are negative for acute change, except as noted in the HPI.    Allergies  Amoxicillin; Codeine;  Epinephrine; Prednisone; and Sulfonamide derivatives  Home Medications   Prior to Admission medications   Medication Sig Start Date End Date Taking? Authorizing Provider  calcium carbonate (OS-CAL) 1250 MG tablet Take 1 tablet by mouth 2 (two) times daily.      Historical Provider, MD  fish oil-omega-3 fatty acids 1000 MG capsule Take 1 g by mouth 3 (three) times daily.      Historical Provider, MD  LORazepam (ATIVAN) 1 MG tablet Take 0.5 tablets (0.5 mg total) by mouth 2 (two) times daily as needed for anxiety. 09/01/15   Agapito Gamesatherine D Metheney, MD  Multiple Vitamin (MULTIVITAMIN) tablet Take 1 tablet by mouth daily.      Historical Provider, MD  Red Yeast Rice Extract (RED YEAST RICE PO) Take by mouth.    Historical Provider, MD  Vitamin D, Cholecalciferol, 400 UNITS TABS Take 1 tablet by mouth daily.    Historical Provider, MD   BP 158/73 mmHg  Pulse 70  Temp(Src) 98 F (36.7 C) (Oral)  Resp 16  Ht 5\' 4"  (1.626 m)  Wt 65.772 kg  BMI 24.88 kg/m2  SpO2 100% Physical Exam  Constitutional: She is oriented to person, place, and time. She appears well-developed and well-nourished. No distress.  HENT:  Head: Normocephalic and atraumatic.  Eyes: Conjunctivae are normal. Scleral icterus is present.  Neck: Normal range of motion.  No JVD present.  Cardiovascular: Normal rate, regular rhythm and normal heart sounds.  Exam reveals no gallop and no friction rub.   No murmur heard. Pulmonary/Chest: Effort normal and breath sounds normal. No respiratory distress.  Abdominal: Soft. Bowel sounds are normal. She exhibits no distension and no mass. There is no tenderness. There is no guarding.  No distension or tenderness. No telangectasia  Neurological: She is alert and oriented to person, place, and time.  Skin: Skin is warm and dry. She is not diaphoretic.  Jaundice     ED Course  Procedures (including critical care time) Labs Review Labs Reviewed  URINALYSIS, ROUTINE W REFLEX MICROSCOPIC  (NOT AT [facility]) - Abnormal; Notable for the following:    Bilirubin Urine SMALL (*)    All other components within normal limits  COMPREHENSIVE METABOLIC PANEL - Abnormal; Notable for the following:    Sodium 132 (*)    Glucose, Bld 141 (*)    BUN 22 (*)    Calcium 8.7 (*)    AST 268 (*)    ALT 558 (*)    Alkaline Phosphatase 601 (*)    Total Bilirubin 8.0 (*)    All other components within normal limits  CBC WITH DIFFERENTIAL/PLATELET - Abnormal; Notable for the following:    Monocytes Absolute 1.4 (*)    All other components within normal limits  LIPASE, BLOOD  PROTIME-INR    Imaging Review No results found. I have personally reviewed and evaluated these images and lab results as part of my medical decision-making.   EKG Interpretation None      MDM   Final diagnoses:  Jaundice  Elevated liver enzymes   Patient here with marked Jaundice. I have ordered CMP/CBC/UA/ and ct abdomen.I have reviewed the medication side effects of Augmentin and <1% of patient can get cholestatic jaundice or  Hemolytic anemia. DDX includes acute hepatitis, biliary/pancreativ mass, autoimmune hepatitis.PT/INR is normal.    Patient with norml HGB,her urine only has slight bilirubin. She has marked elevation in her liver enzymes and bilirubin. Her CT scan is clear. I have spoken with Dr. Marina Goodell of GI about the findings, we agree that this is likelyt a medication reaction, however she will need very close follow up to monitor her liver enzymes. She may follow with Dr. Marina Goodell this week in the office. I have discussed reasons to seek immediate medical care including altered mental status, signs of bleeding, or any new or worsening sxs.patient expresses her understanding and agrees with poc.     Arthor Captain, PA-C 10/05/15 1604  Jerelyn Scott, MD 10/07/15 [phone]

## 2015-10-02 NOTE — ED Notes (Signed)
Patient reports that she was on a rx of augment and finished it on 2/20. The patient states that while she was on it her urine turned "vey yellow", she also has intermittent constipation. Patient reports that she still feels bloated and constipated called the [facility] dr. And told him this story and he was insistent that she come here. LBM was today about 30 minutes prior to arrival. She feels that it is related to the augmentin

## 2015-10-02 NOTE — ED Notes (Signed)
PA at bedside.

## 2015-10-02 NOTE — ED Notes (Signed)
Patient transported to CT 

## 2015-10-03 ENCOUNTER — Telehealth: Payer: Self-pay

## 2015-10-03 NOTE — Telephone Encounter (Signed)
Yes, she can go ahead and do the labs early.

## 2015-10-03 NOTE — Telephone Encounter (Signed)
-----   Message from Hilarie FredricksonJohn N Perry, MD sent at 10/03/2015  8:06 AM EDT ----- Called by ER. New patient to be seen by any provider ----- Message -----    From: Arthor CaptainAbigail Harris, PA-C    Sent: 10/02/2015  10:34 PM      To: Hilarie FredricksonJohn N Perry, MD  Please ensure this patient is seen in your clinic this week given her markedly elevated liver enzymes as we discussed on 10/02/2015 Thank you. Abigail Harris,PA-C

## 2015-10-03 NOTE — Telephone Encounter (Signed)
Pt scheduled to see Lori Hvozdovic, PA-C 10/06/15@8 :15am.

## 2015-10-04 ENCOUNTER — Telehealth: Payer: Self-pay

## 2015-10-04 ENCOUNTER — Ambulatory Visit (INDEPENDENT_AMBULATORY_CARE_PROVIDER_SITE_OTHER): Payer: Medicare Other | Admitting: Family Medicine

## 2015-10-04 ENCOUNTER — Encounter: Payer: Self-pay | Admitting: Family Medicine

## 2015-10-04 VITALS — BP 140/84 | HR 76 | Wt 148.0 lb

## 2015-10-04 DIAGNOSIS — R748 Abnormal levels of other serum enzymes: Secondary | ICD-10-CM | POA: Diagnosis not present

## 2015-10-04 DIAGNOSIS — I10 Essential (primary) hypertension: Secondary | ICD-10-CM

## 2015-10-04 DIAGNOSIS — R17 Unspecified jaundice: Secondary | ICD-10-CM | POA: Diagnosis not present

## 2015-10-04 DIAGNOSIS — T887XXA Unspecified adverse effect of drug or medicament, initial encounter: Secondary | ICD-10-CM

## 2015-10-04 DIAGNOSIS — Z0000 Encounter for general adult medical examination without abnormal findings: Secondary | ICD-10-CM

## 2015-10-04 DIAGNOSIS — R030 Elevated blood-pressure reading, without diagnosis of hypertension: Secondary | ICD-10-CM

## 2015-10-04 DIAGNOSIS — T50905A Adverse effect of unspecified drugs, medicaments and biological substances, initial encounter: Secondary | ICD-10-CM

## 2015-10-04 LAB — LIPID PANEL
CHOL/HDL RATIO: 6.8 ratio — AB (ref <=5.0)
Cholesterol: 380 mg/dL — ABNORMAL HIGH (ref 125–200)
HDL: 56 mg/dL (ref >=46)
LDL Cholesterol: 310 mg/dL — ABNORMAL HIGH (ref <130)
TRIGLYCERIDES: 68 mg/dL (ref <150)
VLDL: 14 mg/dL (ref <30)

## 2015-10-04 LAB — COMPLETE METABOLIC PANEL WITH GFR
ALK PHOS: 664 U/L — AB (ref 33–130)
ALT: 539 U/L — ABNORMAL HIGH (ref 6–29)
AST: 235 U/L — ABNORMAL HIGH (ref 10–35)
Albumin: 4.1 g/dL (ref 3.6–5.1)
BUN: 21 mg/dL (ref 7–25)
CO2: 23 mmol/L (ref 20–31)
Calcium: 9.4 mg/dL (ref 8.6–10.4)
Chloride: 100 mmol/L (ref 98–110)
Creat: 0.87 mg/dL (ref 0.60–0.93)
GFR, EST AFRICAN AMERICAN: 74 mL/min (ref >=60)
GFR, EST NON AFRICAN AMERICAN: 64 mL/min (ref >=60)
GLUCOSE: 95 mg/dL (ref 65–99)
POTASSIUM: 4.7 mmol/L (ref 3.5–5.3)
SODIUM: 133 mmol/L — AB (ref 135–146)
Total Bilirubin: 6.9 mg/dL — ABNORMAL HIGH (ref 0.2–1.2)
Total Protein: 7.2 g/dL (ref 6.1–8.1)

## 2015-10-04 LAB — CBC WITH DIFFERENTIAL/PLATELET
BASOS ABS: 0.1 10*3/uL (ref 0.0–0.1)
Basophils Relative: 1 % (ref 0–1)
EOS PCT: 9 % — AB (ref 0–5)
Eosinophils Absolute: 0.8 10*3/uL — ABNORMAL HIGH (ref 0.0–0.7)
HEMATOCRIT: 38.6 % (ref 36.0–46.0)
HEMOGLOBIN: 13 g/dL (ref 12.0–15.0)
LYMPHS ABS: 1.6 10*3/uL (ref 0.7–4.0)
LYMPHS PCT: 18 % (ref 12–46)
MCH: 31.4 pg (ref 26.0–34.0)
MCHC: 33.7 g/dL (ref 30.0–36.0)
MCV: 93.2 fL (ref 78.0–100.0)
MPV: 10.2 fL (ref 8.6–12.4)
Monocytes Absolute: 1.2 10*3/uL — ABNORMAL HIGH (ref 0.1–1.0)
Monocytes Relative: 14 % — ABNORMAL HIGH (ref 3–12)
NEUTROS ABS: 5.1 10*3/uL (ref 1.7–7.7)
Neutrophils Relative %: 58 % (ref 43–77)
Platelets: 363 10*3/uL (ref 150–400)
RBC: 4.14 MIL/uL (ref 3.87–5.11)
RDW: 14.5 % (ref 11.5–15.5)
WBC: 8.8 10*3/uL (ref 4.0–10.5)

## 2015-10-04 LAB — T3, FREE: T3, Free: 2.6 pg/mL (ref 2.3–4.2)

## 2015-10-04 LAB — TSH: TSH: 3.85 m[IU]/L

## 2015-10-04 LAB — T4, FREE: Free T4: 1.3 ng/dL (ref 0.8–1.8)

## 2015-10-04 NOTE — Progress Notes (Signed)
   Subjective:    Patient ID: Kimberly Durham, female    DOB: [birth year], 80 y.o.   MRN: [id number]  HPI Hypertension- Pt denies chest pain, SOB, dizziness, or heart palpitations.  Taking meds as directed w/o problems.  Denies medication side effects.    I saw her about 6 weeks ago for sinus infection and put her on Augmentin. She had had a reaction him is 15 years ago to amoxicillin where cause of jaundice. She said at the time that she was okay with retrying the medication as she wasn't sure if that was really the cause or not. Unfortunately she did develop jaundice again as well as elevated liver enzymes on the Augmentin.   Review of Systems     Objective:   Physical Exam  Constitutional: She is oriented to person, place, and time. She appears well-developed and well-nourished.  HENT:  Head: Normocephalic and atraumatic.  Cardiovascular: Normal rate, regular rhythm and normal heart sounds.   Pulmonary/Chest: Effort normal and breath sounds normal.  Abdominal: Soft. Bowel sounds are normal. She exhibits no distension and no mass. There is no tenderness. There is no rebound and no guarding.  Neurological: She is alert and oriented to person, place, and time.  Skin: Skin is warm and dry.  + jaundice.   Psychiatric: She has a normal mood and affect. Her behavior is normal.        Assessment & Plan:  Hypertension- borderline today but better than it has been.  She reports home blood pressures have been running in the 130s and she's been following it daily. She forgot to bring her log with her but says she will bring it next time.  Medication reaction-updated allergy list. We will recheck liver enzymes to make sure that there back to baseline.  Jaundice-gave reassurance that this should fade.

## 2015-10-04 NOTE — Telephone Encounter (Signed)
Left message that labs can be done when she comes in.  I ordered a CMP, Lipid Profile, TSH, T763free, T44free, and CBC.  Those are the ones I saw that you routinely have checked in the past.  Is there any others you would like checked?

## 2015-10-05 ENCOUNTER — Other Ambulatory Visit: Payer: Self-pay | Admitting: *Deleted

## 2015-10-05 DIAGNOSIS — R748 Abnormal levels of other serum enzymes: Secondary | ICD-10-CM

## 2015-10-06 ENCOUNTER — Telehealth: Payer: Self-pay | Admitting: Emergency Medicine

## 2015-10-06 ENCOUNTER — Encounter: Payer: Self-pay | Admitting: Physician Assistant

## 2015-10-06 ENCOUNTER — Ambulatory Visit (INDEPENDENT_AMBULATORY_CARE_PROVIDER_SITE_OTHER): Payer: Medicare Other | Admitting: Physician Assistant

## 2015-10-06 VITALS — BP 146/72 | HR 78 | Ht 64.0 in | Wt 148.0 lb

## 2015-10-06 DIAGNOSIS — R7989 Other specified abnormal findings of blood chemistry: Secondary | ICD-10-CM | POA: Diagnosis not present

## 2015-10-06 DIAGNOSIS — R945 Abnormal results of liver function studies: Secondary | ICD-10-CM

## 2015-10-06 NOTE — Telephone Encounter (Signed)
I will make a note of this. Thanks.Kimberly Durham, Kimberly Durham

## 2015-10-06 NOTE — Progress Notes (Signed)
Patient ID: Kimberly Durham, female   DOB: [birth year], 80 y.o.   MRN: [id number]    HPI:  Kimberly Durham is a 80 y.o.   female referred by Agapito GamesMetheney, Catherine D, * for evaluation of abnormal LFTs and jaundice. Kimberly Durham has a history of anxiety, migraines, osteoporosis, and dyslipidemia. She reports that 15-20 years ago, she was prescribed amoxicillin and developed painless jaundice. In February she was evaluated for a sinus infection and was prescribed Augmentin. Shortly after completing the Augmentin she noticed that her urine became very dark and she became jaundiced. She had mild nausea but no vomiting. She had no abdominal pain. She was seen [facility] and noted to have abnormal labs with a total bili of 8, alkaline phosphatase 601, AST 268, ALT 558. CT of the abdomen and pelvis showed a probable small cyst in the left hepatic lobe. No suspicious hepatic findings. No evidence of gallstones, gallbladder wall thickening or biliary dilatation. The gallbladder is incompletely distended. Pancreas was unremarkable. Stomach and bowel had no evidence of bowel wall thickening, distention, or surrounding inflammatory change. Repeat labs 2 days later on March 14 showed a totally ileus 6.9, alkaline phosphatase 64, AST 235, ALT 539. Patient's jaundice has resolved. She has no nausea, vomiting or epigastric pain.  Patient has had no change in her bowel habits or stool caliber. She states she had a flexible sigmoidoscopy years ago and was told it was fine. She denies family history of colon cancer, colon polyps, or inflammatory bowel disease.   Past Medical History  Diagnosis Date  . Hemorrhoids   . Migraines   . Anxiety   . Osteoporosis   . White coat hypertension   . Dyslipidemia     Past Surgical History  Procedure Laterality Date  . Abdominal hysterectomy    . Oophorectomy    . Flexible sigmoidoscopy  08/2002    negative   Family History  Problem Relation Age of Onset   . Heart disease Mother   . Diabetes Mother   . COPD Father     smoker  . Stroke Brother [id number]  . Parkinsonism Brother    Social History  Substance Use Topics  . Smoking status: Never Smoker   . Smokeless tobacco: None  . Alcohol Use: Yes   Current Outpatient Prescriptions  Medication Sig Dispense Refill  . calcium carbonate (OS-CAL) 1250 MG tablet Take 1 tablet by mouth 2 (two) times daily.      . fish oil-omega-3 fatty acids 1000 MG capsule Take 1 g by mouth 3 (three) times daily.      Marland Kitchen. LORazepam (ATIVAN) 1 MG tablet Take 0.5 tablets (0.5 mg total) by mouth 2 (two) times daily as needed for anxiety. 30 tablet 0  . Multiple Vitamin (MULTIVITAMIN) tablet Take 1 tablet by mouth daily.      . Red Yeast Rice Extract (RED YEAST RICE PO) Take by mouth.    . Vitamin D, Cholecalciferol, 400 UNITS TABS Take 1 tablet by mouth daily.     No current facility-administered medications for this visit.   Allergies  Allergen Reactions  . Amoxicillin Other (See Comments)    Jaundice with elevated liver enzymes  . Augmentin [Amoxicillin-Pot Clavulanate]   . Codeine     REACTION: trouble breathing, palpitations  . Epinephrine     REACTION: trouble breathing, palpitations  . Prednisone     REACTION: ?  . Sulfonamide Derivatives     REACTION: rash  Review of Systems: Gen: Denies any fever, chills, sweats, anorexia, fatigue, weakness, malaise, weight loss, and sleep disorder CV: Denies chest pain, angina, palpitations, syncope, orthopnea, PND, peripheral edema, and claudication. Resp: Denies dyspnea at rest, dyspnea with exercise, cough, sputum, wheezing, coughing up blood, and pleurisy. GI: Denies vomiting blood,  and fecal incontinence.   Denies dysphagia or odynophagia. Admits to recent jaundice. GU : Denies urinary burning, blood in urine, urinary frequency, urinary hesitancy, nocturnal urination, and urinary incontinence. MS: Denies joint pain, limitation of movement, and swelling,  stiffness, low back pain, extremity pain. Denies muscle weakness, cramps, atrophy.  Derm: Denies rash, itching, dry skin, hives, moles, warts, or unhealing ulcers.  Psych: Denies depression, anxiety, memory loss, suicidal ideation, hallucinations, paranoia, and confusion. Heme: Denies bruising, bleeding, and enlarged lymph nodes. Neuro:  Denies any headaches, dizziness, paresthesias. Endo:  Denies any problems with DM, thyroid, adrenal function  Studies: Ct Abdomen Pelvis W Contrast  10/02/2015  CLINICAL DATA:  Constipation, bloating following recent antibiotic therapy. Previous hysterectomy. EXAM: CT ABDOMEN AND PELVIS WITH CONTRAST TECHNIQUE: Multidetector CT imaging of the abdomen and pelvis was performed using the standard protocol following bolus administration of intravenous contrast. CONTRAST:  OMNIPAQUE IOHEXOL 300 MG/ML SOLN, 25mL OMNIPAQUE IOHEXOL 300 MG/ML SOLN COMPARISON:  None. FINDINGS: Lower chest: Mild subpleural reticulation in both lung bases. No airspace disease, pleural or pericardial effusion. There is aortic atherosclerosis. Hepatobiliary: Probable small cyst in the left hepatic lobe. No suspicious hepatic findings. No evidence of gallstones, gallbladder wall thickening or biliary dilatation. The gallbladder is incompletely distended. Pancreas: Unremarkable. No pancreatic ductal dilatation or surrounding inflammatory changes. Spleen: Normal in size without focal abnormality. Adrenals/Urinary Tract: Both adrenal glands appear normal. The kidneys appear normal without evidence of urinary tract calculus, suspicious lesion or hydronephrosis. No bladder abnormalities are seen. Stomach/Bowel: No evidence of bowel wall thickening, distention or surrounding inflammatory change. Colonic stool burden does not appear significantly increased. There are scattered distal colonic diverticular changes. Appendix not visualized. Vascular/Lymphatic: There are no enlarged abdominal or pelvic lymph  nodes. Mild aortic and branch vessel atherosclerosis and tortuosity. Reproductive: Hysterectomy. No evidence of adnexal mass. Multiple pelvic phleboliths. Other: No evidence of abdominal wall mass or hernia.  No ascites. Musculoskeletal: No acute or significant osseous findings. There are degenerative changes throughout the lumbar spine associate with a convex left scoliosis. IMPRESSION: 1. No acute findings or explanation for the patient's symptoms. 2. Colonic stool burden does not appear significantly increased. No inflammatory changes are identified. 3. Atherosclerosis and postsurgical changes as described. 4. Lumbar spondylosis and convex left scoliosis. Electronically Signed   By: Carey Bullocks M.D.   On: 10/02/2015 20:48    LAB RESULTS:  Recent Labs  10/04/15 0941  WBC 8.8  HGB 13.0  HCT 38.6  PLT 363   BMET  Recent Labs  10/04/15 0945  NA 133*  K 4.7  CL 100  CO2 23  GLUCOSE 95  BUN 21  CREATININE 0.87  CALCIUM 9.4   LFT  Recent Labs  10/04/15 0945  PROT 7.2  ALBUMIN 4.1  AST 235*  ALT 539*  ALKPHOS 664*  BILITOT 6.9*   Physical Exam: BP 146/72 mmHg  Pulse 78  Ht  (1.626 m)  Wt 148 lb (67.132 kg)  BMI 25.39 kg/m2 Constitutional: Pleasant,well-developed, female in no acute distress. HEENT: Normocephalic and atraumatic. Conjunctivae are normal. No scleral icterus. Neck supple. No JVD Cardiovascular: Normal rate, regular rhythm.  Pulmonary/chest: Effort normal and breath sounds normal.  No wheezing, rales or rhonchi. Abdominal: Soft, nondistended, nontender. Bowel sounds active throughout. There are no masses palpable. No hepatomegaly. Extremities: no edema Lymphadenopathy: No cervical adenopathy noted. Neurological: Alert and oriented to person place and time. Skin: Skin is warm and dry. No rashes noted. Psychiatric: Normal mood and affect. Behavior is normal.  ASSESSMENT AND PLAN: 80 year old female with a prior history of drug-induced liver  injury secondary to amoxicillin years ago, who recently completed a course of Augmentin and again developed jaundice. Labs were in a cholecystic pattern but imaging was non-remarkable. Patient's has had resolution of her jaundice and is essentially symptom-free at this time. She has a lab slip to have repeat labs drawn in [place] the 20th. We will contact her primary care provider to have those results forwarded here. She will then have repeat LFTs with an acute hepatitis panel drawn in 3 weeks. She will follow up here in 6 weeks to be sure her labs have normalized. At that time we can revisit colorectal cancer screening, i.e. possible cologuard, as the patient was not interested in any form of screening at today's visit.    Kimberly Durham, Moise Boring 10/06/2015, 8:34 AM  CC: Agapito Games, *

## 2015-10-06 NOTE — Telephone Encounter (Signed)
At today's visit Lori Hvozdovic ordered lab work to be be drawn in 3 weeks. The patient has the order forms and the orders are in EPIC. I understand the patient will have some labs drawn next week. Could you please send those results to Stan Headarl Gessner, MD. When they have resulted? Thank you.

## 2015-10-07 NOTE — Progress Notes (Signed)
Agree w/ Ms. Hvozdovic's note and mangement.  

## 2015-10-11 ENCOUNTER — Other Ambulatory Visit: Payer: Self-pay | Admitting: Family Medicine

## 2015-10-11 DIAGNOSIS — R7889 Finding of other specified substances, not normally found in blood: Secondary | ICD-10-CM

## 2015-10-11 DIAGNOSIS — R7989 Other specified abnormal findings of blood chemistry: Secondary | ICD-10-CM

## 2015-10-11 DIAGNOSIS — R748 Abnormal levels of other serum enzymes: Secondary | ICD-10-CM

## 2015-10-11 DIAGNOSIS — R7401 Elevation of levels of liver transaminase levels: Secondary | ICD-10-CM

## 2015-10-11 DIAGNOSIS — R945 Abnormal results of liver function studies: Secondary | ICD-10-CM

## 2015-10-11 DIAGNOSIS — R740 Nonspecific elevation of levels of transaminase and lactic acid dehydrogenase [LDH]: Principal | ICD-10-CM

## 2015-10-11 LAB — HEPATIC FUNCTION PANEL
ALT: 257 U/L — AB (ref 6–29)
AST: 109 U/L — AB (ref 10–35)
Albumin: 4.2 g/dL (ref 3.6–5.1)
Alkaline Phosphatase: 766 U/L — ABNORMAL HIGH (ref 33–130)
BILIRUBIN DIRECT: 1.2 mg/dL — AB (ref <=0.2)
BILIRUBIN INDIRECT: 1.4 mg/dL — AB (ref 0.2–1.2)
TOTAL PROTEIN: 7.1 g/dL (ref 6.1–8.1)
Total Bilirubin: 2.6 mg/dL — ABNORMAL HIGH (ref 0.2–1.2)

## 2015-10-11 NOTE — Progress Notes (Signed)
Labs ordered per Dr. Daisy BlossomHvozdovic. Need to be faxed to [phone].

## 2015-10-12 LAB — AMYLASE: AMYLASE: 77 U/L (ref 0–105)

## 2015-10-12 LAB — HEPATIC FUNCTION PANEL
ALBUMIN: 4.3 g/dL (ref 3.6–5.1)
ALT: 208 U/L — ABNORMAL HIGH (ref 6–29)
AST: 94 U/L — AB (ref 10–35)
Alkaline Phosphatase: 711 U/L — ABNORMAL HIGH (ref 33–130)
BILIRUBIN TOTAL: 2.2 mg/dL — AB (ref 0.2–1.2)
Bilirubin, Direct: 1 mg/dL — ABNORMAL HIGH (ref <=0.2)
Indirect Bilirubin: 1.2 mg/dL (ref 0.2–1.2)
Total Protein: 7.3 g/dL (ref 6.1–8.1)

## 2015-10-12 LAB — LIPASE: Lipase: 22 U/L (ref 7–60)

## 2015-10-12 LAB — BASIC METABOLIC PANEL
BUN: 20 mg/dL (ref 7–25)
CALCIUM: 9.6 mg/dL (ref 8.6–10.4)
CO2: 25 mmol/L (ref 20–31)
CREATININE: 0.83 mg/dL (ref 0.60–0.93)
Chloride: 98 mmol/L (ref 98–110)
Glucose, Bld: 97 mg/dL (ref 65–99)
Potassium: 4.4 mmol/L (ref 3.5–5.3)
Sodium: 136 mmol/L (ref 135–146)

## 2015-10-13 LAB — HEPATITIS A ANTIBODY, IGM: Hep A IgM: NONREACTIVE

## 2015-10-13 LAB — HEPATITIS B SURFACE ANTIGEN: Hepatitis B Surface Ag: NEGATIVE

## 2015-10-13 LAB — HEPATITIS C ANTIBODY: HCV Ab: NEGATIVE

## 2015-10-13 LAB — HEPATITIS B SURFACE ANTIBODY,QUALITATIVE: HEP B S AB: NEGATIVE

## 2015-10-14 ENCOUNTER — Telehealth: Payer: Self-pay | Admitting: *Deleted

## 2015-10-14 DIAGNOSIS — R748 Abnormal levels of other serum enzymes: Secondary | ICD-10-CM

## 2015-10-14 NOTE — Addendum Note (Signed)
Addended by: Wyline BeadyMCCRIMMON, ANDREA C on: 10/14/2015 09:22 AM   Modules accepted: Orders

## 2015-10-14 NOTE — Telephone Encounter (Signed)
Lab order

## 2015-10-25 ENCOUNTER — Telehealth: Payer: Self-pay | Admitting: *Deleted

## 2015-10-25 LAB — HEPATIC FUNCTION PANEL
ALK PHOS: 280 U/L — AB (ref 33–130)
ALT: 43 U/L — ABNORMAL HIGH (ref 6–29)
AST: 29 U/L (ref 10–35)
Albumin: 4 g/dL (ref 3.6–5.1)
BILIRUBIN DIRECT: 0.4 mg/dL — AB (ref <=0.2)
BILIRUBIN TOTAL: 1.1 mg/dL (ref 0.2–1.2)
Indirect Bilirubin: 0.7 mg/dL (ref 0.2–1.2)
Total Protein: 6.7 g/dL (ref 6.1–8.1)

## 2015-11-03 ENCOUNTER — Other Ambulatory Visit: Payer: Self-pay | Admitting: Family Medicine

## 2015-11-03 MED ORDER — LORAZEPAM 1 MG PO TABS: 0.5000 mg | ORAL_TABLET | Freq: Two times a day (BID) | ORAL | Status: DC | PRN

## 2015-11-11 ENCOUNTER — Encounter: Payer: Self-pay | Admitting: Family Medicine

## 2015-11-11 ENCOUNTER — Other Ambulatory Visit: Payer: Self-pay | Admitting: *Deleted

## 2015-11-11 DIAGNOSIS — R7989 Other specified abnormal findings of blood chemistry: Secondary | ICD-10-CM

## 2015-11-11 DIAGNOSIS — R945 Abnormal results of liver function studies: Principal | ICD-10-CM

## 2015-11-15 ENCOUNTER — Encounter: Payer: Self-pay | Admitting: Family Medicine

## 2015-11-15 ENCOUNTER — Ambulatory Visit (INDEPENDENT_AMBULATORY_CARE_PROVIDER_SITE_OTHER): Payer: Medicare Other | Admitting: Family Medicine

## 2015-11-15 VITALS — BP 138/86 | HR 69 | Wt 146.0 lb

## 2015-11-15 DIAGNOSIS — I700 Atherosclerosis of aorta: Secondary | ICD-10-CM | POA: Diagnosis not present

## 2015-11-15 DIAGNOSIS — R748 Abnormal levels of other serum enzymes: Secondary | ICD-10-CM

## 2015-11-15 DIAGNOSIS — I10 Essential (primary) hypertension: Secondary | ICD-10-CM

## 2015-11-15 LAB — COMPLETE METABOLIC PANEL WITH GFR
ALT: 21 U/L (ref 6–29)
AST: 21 U/L (ref 10–35)
Albumin: 4.1 g/dL (ref 3.6–5.1)
Alkaline Phosphatase: 105 U/L (ref 33–130)
BILIRUBIN TOTAL: 0.8 mg/dL (ref 0.2–1.2)
BUN: 21 mg/dL (ref 7–25)
CALCIUM: 9.1 mg/dL (ref 8.6–10.4)
CO2: 28 mmol/L (ref 20–31)
CREATININE: 0.79 mg/dL (ref 0.60–0.93)
Chloride: 100 mmol/L (ref 98–110)
GFR, EST AFRICAN AMERICAN: 83 mL/min (ref >=60)
GFR, EST NON AFRICAN AMERICAN: 72 mL/min (ref >=60)
Glucose, Bld: 91 mg/dL (ref 65–99)
Potassium: 4.2 mmol/L (ref 3.5–5.3)
Sodium: 135 mmol/L (ref 135–146)
TOTAL PROTEIN: 6.8 g/dL (ref 6.1–8.1)

## 2015-11-15 NOTE — Progress Notes (Signed)
Quick Note:  All labs are normal. ______ 

## 2015-11-15 NOTE — Progress Notes (Signed)
   Subjective:    Patient ID: Kimberly Durham, female    DOB: 12/28/1935, 80 y.o.   MRN: [id number]  HPI Abnormal liver enzymes-secondary to medication side effect that caused acute jaundice and elevation in LFTs. She went to the lab and had them rechecked earlier this week and they are completely normal back into the normal range. She wonders if she really needs to follow back up with GI or not.Note, she has not been taking red yeast rice even though it is on her list. She has been holding that.  Hypertension- Pt denies chest pain, SOB, dizziness, or heart palpitations.  Taking meds as directed w/o problems.  Denies medication side effects.    Also to follow-up with her on aortic atherosclerosis that was noted on CT scan that she had in the emergency room. We have not addressed it previously as we were trying to just make sure that her liver recovered. She does currently take red yeast rice.   Review of Systems     Objective:   Physical Exam  Constitutional: She is oriented to person, place, and time. She appears well-developed and well-nourished.  HENT:  Head: Normocephalic and atraumatic.  Cardiovascular: Normal rate, regular rhythm and normal heart sounds.   Pulmonary/Chest: Effort normal and breath sounds normal.  Neurological: She is alert and oriented to person, place, and time.  Skin: Skin is warm and dry.  Psychiatric: She has a normal mood and affect. Her behavior is normal.          Assessment & Plan:  Abnormal liver enzymes, acute hepatitis secondary to medication reaction-up repeat blood work shows that her enzymes are all completely back to baseline which is fantastic. We'll continue to monitor carefully. Plan to recheck in 3 months.  Hypertension-she did bring in her log today. She had forgotten at the last office visit. All home blood pressures look absolutely fantastic with systolics in the 120s to 130s. And most of the diastolics running between the 60s and 70s. Pulse  mainly running in the low 60s to upper 50s. Continue current regimen. Follow-up in 6 months. Next  Aortic atherosclerosis-We discussed diagnosis noted on the scan. We also discussed potentially adding a statin to her regimen. She has had mildly elevated lipids in the past. They were recently extremely elevated but that was during her acute hepatitis episode. We discussed pros and cons and potential side effects of the medication. I like her to think about it over the next couple months and then let me know what she would like to do.

## 2015-11-28 ENCOUNTER — Ambulatory Visit: Payer: Medicare Other | Admitting: Internal Medicine

## 2015-12-15 NOTE — Telephone Encounter (Signed)
Error

## 2016-01-03 ENCOUNTER — Other Ambulatory Visit: Payer: Self-pay | Admitting: Osteopathic Medicine

## 2016-01-03 MED ORDER — LORAZEPAM 1 MG PO TABS: 0.5000 mg | ORAL_TABLET | Freq: Two times a day (BID) | ORAL | Status: DC | PRN

## 2016-01-03 NOTE — Telephone Encounter (Signed)
Routed to PCP for review

## 2016-01-18 ENCOUNTER — Encounter: Payer: Self-pay | Admitting: Family Medicine

## 2016-02-06 ENCOUNTER — Other Ambulatory Visit: Payer: Self-pay

## 2016-02-06 ENCOUNTER — Encounter: Payer: Self-pay | Admitting: Family Medicine

## 2016-02-06 DIAGNOSIS — R748 Abnormal levels of other serum enzymes: Secondary | ICD-10-CM

## 2016-02-07 ENCOUNTER — Encounter: Payer: Self-pay | Admitting: Family Medicine

## 2016-02-07 LAB — COMPLETE METABOLIC PANEL WITH GFR
ALT: 16 U/L (ref 6–29)
AST: 22 U/L (ref 10–35)
Albumin: 4.2 g/dL (ref 3.6–5.1)
Alkaline Phosphatase: 60 U/L (ref 33–130)
BUN: 15 mg/dL (ref 7–25)
CHLORIDE: 97 mmol/L — AB (ref 98–110)
CO2: 25 mmol/L (ref 20–31)
CREATININE: 0.97 mg/dL — AB (ref 0.60–0.93)
Calcium: 9.5 mg/dL (ref 8.6–10.4)
GFR, EST AFRICAN AMERICAN: 65 mL/min (ref >=60)
GFR, EST NON AFRICAN AMERICAN: 56 mL/min — AB (ref >=60)
Glucose, Bld: 96 mg/dL (ref 65–99)
POTASSIUM: 5.3 mmol/L (ref 3.5–5.3)
Sodium: 132 mmol/L — ABNORMAL LOW (ref 135–146)
Total Bilirubin: 0.8 mg/dL (ref 0.2–1.2)
Total Protein: 7.1 g/dL (ref 6.1–8.1)

## 2016-02-07 LAB — LIPID PANEL
CHOL/HDL RATIO: 1.8 ratio (ref <=5.0)
CHOLESTEROL: 244 mg/dL — AB (ref 125–200)
HDL: 134 mg/dL (ref >=46)
LDL CALC: 83 mg/dL (ref <130)
TRIGLYCERIDES: 135 mg/dL (ref <150)
VLDL: 27 mg/dL (ref <30)

## 2016-02-15 LAB — BASIC METABOLIC PANEL
BUN: 14 mg/dL (ref 7–25)
CO2: 24 mmol/L (ref 20–31)
Calcium: 9.4 mg/dL (ref 8.6–10.4)
Chloride: 100 mmol/L (ref 98–110)
Creat: 0.92 mg/dL (ref 0.60–0.93)
GLUCOSE: 97 mg/dL (ref 65–99)
POTASSIUM: 4.4 mmol/L (ref 3.5–5.3)
SODIUM: 135 mmol/L (ref 135–146)

## 2016-02-15 NOTE — Progress Notes (Signed)
All labs are normal. 

## 2016-02-21 LAB — HM MAMMOGRAPHY

## 2016-02-23 ENCOUNTER — Encounter: Payer: Self-pay | Admitting: *Deleted

## 2016-03-05 ENCOUNTER — Other Ambulatory Visit: Payer: Self-pay | Admitting: Family Medicine

## 2016-03-06 ENCOUNTER — Other Ambulatory Visit: Payer: Self-pay | Admitting: *Deleted

## 2016-03-06 MED ORDER — LORAZEPAM 1 MG PO TABS: 0.5000 mg | ORAL_TABLET | Freq: Two times a day (BID) | ORAL | 1 refills | Status: DC | PRN

## 2016-05-08 ENCOUNTER — Encounter: Payer: Self-pay | Admitting: Family Medicine

## 2016-05-10 ENCOUNTER — Other Ambulatory Visit: Payer: Self-pay

## 2016-05-10 DIAGNOSIS — N183 Chronic kidney disease, stage 3 unspecified: Secondary | ICD-10-CM

## 2016-05-10 LAB — BASIC METABOLIC PANEL WITH GFR
BUN: 22 mg/dL (ref 7–25)
CHLORIDE: 100 mmol/L (ref 98–110)
CO2: 27 mmol/L (ref 20–31)
Calcium: 9.8 mg/dL (ref 8.6–10.4)
Creat: 1.1 mg/dL — ABNORMAL HIGH (ref 0.60–0.93)
GFR, EST NON AFRICAN AMERICAN: 48 mL/min — AB (ref >=60)
GFR, Est African American: 56 mL/min — ABNORMAL LOW (ref >=60)
Glucose, Bld: 101 mg/dL — ABNORMAL HIGH (ref 65–99)
POTASSIUM: 4.5 mmol/L (ref 3.5–5.3)
SODIUM: 136 mmol/L (ref 135–146)

## 2016-05-11 LAB — MICROALBUMIN / CREATININE URINE RATIO
Creatinine, Urine: 49 mg/dL (ref 20–320)
MICROALB UR: 0.6 mg/dL
MICROALB/CREAT RATIO: 12 ug/mg{creat} (ref <30)

## 2016-05-11 NOTE — Addendum Note (Signed)
Addended by: Deno EtienneBARKLEY, Chenise Mulvihill L on: 05/11/2016 09:25 AM   Modules accepted: Orders

## 2016-05-16 ENCOUNTER — Encounter: Payer: Medicare Other | Admitting: Family Medicine

## 2016-06-01 ENCOUNTER — Ambulatory Visit (INDEPENDENT_AMBULATORY_CARE_PROVIDER_SITE_OTHER): Payer: Medicare Other | Admitting: Physician Assistant

## 2016-06-01 VITALS — BP 179/82 | HR 92 | Temp 97.5°F

## 2016-06-01 DIAGNOSIS — Z23 Encounter for immunization: Secondary | ICD-10-CM | POA: Diagnosis not present

## 2016-06-01 DIAGNOSIS — R030 Elevated blood-pressure reading, without diagnosis of hypertension: Secondary | ICD-10-CM

## 2016-06-01 DIAGNOSIS — I10 Essential (primary) hypertension: Secondary | ICD-10-CM | POA: Insufficient documentation

## 2016-06-01 NOTE — Progress Notes (Signed)
Pt is here for the flu shot. Denies past allergic reaction to the flu shot.  Her BP was elevated. Pt stated that she have serve white coat syndrome. Pt tolerated injection well without complications. -[facility]/RMA

## 2016-06-26 ENCOUNTER — Other Ambulatory Visit: Payer: Self-pay | Admitting: *Deleted

## 2016-06-26 DIAGNOSIS — N183 Chronic kidney disease, stage 3 unspecified: Secondary | ICD-10-CM

## 2016-06-26 DIAGNOSIS — E785 Hyperlipidemia, unspecified: Secondary | ICD-10-CM

## 2016-06-26 MED ORDER — LORAZEPAM 1 MG PO TABS: 0.5000 mg | ORAL_TABLET | Freq: Two times a day (BID) | ORAL | 1 refills | Status: DC | PRN

## 2016-06-28 LAB — BASIC METABOLIC PANEL WITH GFR
BUN: 19 mg/dL (ref 7–25)
CHLORIDE: 100 mmol/L (ref 98–110)
CO2: 27 mmol/L (ref 20–31)
Calcium: 9.8 mg/dL (ref 8.6–10.4)
Creat: 0.99 mg/dL — ABNORMAL HIGH (ref 0.60–0.93)
GFR, Est African American: 63 mL/min (ref >=60)
GFR, Est Non African American: 55 mL/min — ABNORMAL LOW (ref >=60)
Glucose, Bld: 97 mg/dL (ref 65–99)
POTASSIUM: 4.5 mmol/L (ref 3.5–5.3)
SODIUM: 136 mmol/L (ref 135–146)

## 2016-06-28 LAB — LIPID PANEL
CHOL/HDL RATIO: 2.4 ratio (ref <5.0)
Cholesterol: 275 mg/dL — ABNORMAL HIGH (ref <200)
HDL: 117 mg/dL (ref >50)
LDL CALC: 125 mg/dL — AB (ref <100)
Triglycerides: 163 mg/dL — ABNORMAL HIGH (ref <150)
VLDL: 33 mg/dL — AB (ref <30)

## 2016-07-03 ENCOUNTER — Ambulatory Visit (INDEPENDENT_AMBULATORY_CARE_PROVIDER_SITE_OTHER): Payer: Medicare Other | Admitting: Family Medicine

## 2016-07-03 ENCOUNTER — Encounter: Payer: Self-pay | Admitting: Family Medicine

## 2016-07-03 VITALS — BP 187/82 | HR 72 | Ht 64.0 in | Wt 152.0 lb

## 2016-07-03 DIAGNOSIS — Z0000 Encounter for general adult medical examination without abnormal findings: Secondary | ICD-10-CM | POA: Diagnosis not present

## 2016-07-03 NOTE — Patient Instructions (Signed)

## 2016-07-03 NOTE — Progress Notes (Signed)
Subjective:   Kimberly Durham is a 80 y.o. female who presents for Medicare Annual (Subsequent) preventive examination.  She does want me to look at the incision on her left lower leg. 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 She says it doesn't look good. It has been keeping her from exercising. Has not been actively draining. She's just been placing a little Vaseline on it.  Review of Systems:  Comprehensive ROS is negative.         Objective:     Vitals: BP (!) 187/82   Pulse 72   Ht 5\' 4"  (1.626 m)   Wt 152 lb (68.9 kg)   SpO2 100%   BMI 26.09 kg/m   Body mass index is 26.09 kg/m.  Physical Exam  Constitutional: She is oriented to person, place, and time. She appears well-developed and well-nourished.  HENT:  Head: Normocephalic and atraumatic.  Right Ear: External ear normal.  Left Ear: External ear normal.  Nose: Nose normal.  Mouth/Throat: Oropharynx is clear and moist.  TMs and canals are clear.   Eyes: Conjunctivae and EOM are normal. Pupils are equal, round, and reactive to light.  Neck: Neck supple. No thyromegaly present.  Cardiovascular: Normal rate, regular rhythm and normal heart sounds.   No carotid bruit   Pulmonary/Chest: Effort normal and breath sounds normal. She has no wheezes.  Abdominal: Soft. Bowel sounds are normal.  Lymphadenopathy:    She has no cervical adenopathy.  Neurological: She is alert and oriented to person, place, and time.  Skin: Skin is warm and dry.  Along the incision on her left lower leg at the distal edge of it it almost looks like a squamous cell is growing back.  Psychiatric: She has a normal mood and affect.     Tobacco History  Smoking Status  . Never Smoker  Smokeless Tobacco  . Not on file     Counseling given: Not Answered   Past Medical History:  Diagnosis Date  . Anxiety   . Dyslipidemia   . Hemorrhoids   . Migraines   . Osteoporosis   . White coat hypertension    Past Surgical History:   Procedure Laterality Date  . ABDOMINAL HYSTERECTOMY    . FLEXIBLE SIGMOIDOSCOPY  08/2002   negative  . OOPHORECTOMY     Family History  Problem Relation Age of Onset  . Heart disease Mother   . Diabetes Mother   . COPD Father     smoker  . Stroke Brother [phone]  . Parkinsonism Brother    History  Sexual Activity  . Sexual activity: Yes  . Partners: Male    Outpatient Encounter Prescriptions as of 07/03/2016  Medication Sig  . calcium carbonate (OS-CAL) 1250 MG tablet Take 1 tablet by mouth 2 (two) times daily.    . fish oil-omega-3 fatty acids 1000 MG capsule Take 1 g by mouth 3 (three) times daily.    Marland Kitchen. LORazepam (ATIVAN) 1 MG tablet Take 0.5 tablets (0.5 mg total) by mouth 2 (two) times daily as needed for anxiety. FAX: [phone]  . Multiple Vitamin (MULTIVITAMIN) tablet Take 1 tablet by mouth daily.    . Red Yeast Rice Extract (RED YEAST RICE PO) Take 1 capsule by mouth.  . Vitamin D, Cholecalciferol, 400 UNITS TABS Take 1 tablet by mouth daily.   No facility-administered encounter medications on file as of 07/03/2016.     Activities of Daily Living No flowsheet data found.  Patient Care Team: Santina Evansatherine  Randell Patient Metheney, MD as PCP - General (Family Medicine) Tollie PizzaLori P Hvozdovic, PA-C (Inactive) as Physician Assistant (Gastroenterology) Lucinda DellBrian S Strauss, MD (Dermatology)    Assessment:    Medicare Wellness Exam   Exercise Activities and Dietary recommendations    Goals    None     Fall Risk Fall Risk  07/03/2016 05/18/2015 05/17/2014 05/15/2013  Falls in the past year? No No No No   Depression Screen PHQ 2/9 Scores 07/03/2016 05/18/2015 05/17/2014 05/15/2013  PHQ - 2 Score 0 1 0 0     Cognitive Function     6CIT Screen 07/03/2016  What Year? 0 points  What month? 0 points  What time? 0 points  Count back from 20 0 points  Months in reverse 0 points  Repeat phrase 0 points  Total Score 0    Immunization History  Administered Date(s) Administered  .  Influenza Split 05/15/2012  . Influenza Whole 05/26/2007, 04/20/2008, 04/18/2009, 04/25/2010  . Influenza, High Dose Seasonal PF 06/01/2016  . Influenza,inj,Quad PF,36+ Mos 05/15/2013, 05/17/2014, 05/18/2015  . Pneumococcal Conjugate-13 06/23/2014  . Pneumococcal Polysaccharide-23 03/04/2003  . Td 02/20/2002  . Tdap 05/15/2012  . Zoster 04/25/2010   Screening Tests Health Maintenance  Topic Date Due  . MAMMOGRAM  08/23/2016  . TETANUS/TDAP  05/15/2022  . INFLUENZA VACCINE  Completed  . DEXA SCAN  Completed  . ZOSTAVAX  Completed  . PNA vac Low Risk Adult  Completed      Plan:    During the course of the visit the patient was educated and counseled about the following appropriate screening and preventive services:   Vaccines to include Pneumoccal, Influenza, Hepatitis B, Td, Zostavax, HCV - UTD.    Cardiovascular Disease  Colorectal cancer screening  Bone density screening  Diabetes screening  Glaucoma screening - Eye exam - UTD  Mammography/PAP - UTD  Abnormal skin lesion-it almost looks like a squamous cell is growing back underneath the excision that was done about a month and a half ago. Reglan to call and see if we can get her in with one of Dr. Stefanie LibelStrauss partners. Evidently he has been out of the office.Marland Kitchen. She would like to see if Dr. Jeanella CrazePierce is available.    Patient Instructions (the written plan) was given to the patient.   METHENEY,CATHERINE, MD  07/03/2016

## 2016-07-04 ENCOUNTER — Telehealth: Payer: Self-pay

## 2016-07-04 NOTE — Telephone Encounter (Signed)
Ok , this sounds so much better.

## 2016-07-04 NOTE — Telephone Encounter (Signed)
FYI  Genelda called and states she has some blood pressure readings from yesterday and last night.   137/73 126/62

## 2016-08-28 LAB — HM MAMMOGRAPHY

## 2016-09-07 ENCOUNTER — Encounter: Payer: Self-pay | Admitting: Family Medicine

## 2016-09-20 ENCOUNTER — Ambulatory Visit (INDEPENDENT_AMBULATORY_CARE_PROVIDER_SITE_OTHER): Payer: Medicare Other | Admitting: Family Medicine

## 2016-09-20 VITALS — BP 207/114 | HR 73 | Temp 97.9°F | Wt 151.0 lb

## 2016-09-20 DIAGNOSIS — H918X2 Other specified hearing loss, left ear: Secondary | ICD-10-CM | POA: Diagnosis not present

## 2016-09-20 DIAGNOSIS — H6122 Impacted cerumen, left ear: Secondary | ICD-10-CM | POA: Diagnosis not present

## 2016-09-20 DIAGNOSIS — R030 Elevated blood-pressure reading, without diagnosis of hypertension: Secondary | ICD-10-CM | POA: Diagnosis not present

## 2016-09-20 NOTE — Progress Notes (Signed)
Subjective:    Patient ID: Kimberly Durham, female    DOB: 09-02-1935, 81 y.o.   MRN: [id number]  HPI She comes in complaining of left ear fullness. She said she woke up in the middle of night and felt like she suddenly could not hear out of her left ear. No recent laboratory infections fevers chills or drainage from the ear. She did put some sweet oil in it this morning. She feels like her blood pressures up a little bit because she feels anxious.   Review of Systems  BP (!) 207/114   Pulse 73   Temp 97.9 F (36.6 C) (Oral)   Wt 151 lb (68.5 kg)   BMI 25.92 kg/m     Allergies  Allergen Reactions  . Amoxicillin Other (See Comments)    Jaundice with elevated liver enzymes  . Augmentin [Amoxicillin-Pot Clavulanate]   . Codeine     REACTION: trouble breathing, palpitations  . Epinephrine     REACTION: trouble breathing, palpitations  . Prednisone     REACTION: ?  . Sulfonamide Derivatives     REACTION: rash    Past Medical History:  Diagnosis Date  . Anxiety   . Dyslipidemia   . Hemorrhoids   . Migraines   . Osteoporosis   . White coat hypertension     Past Surgical History:  Procedure Laterality Date  . ABDOMINAL HYSTERECTOMY    . FLEXIBLE SIGMOIDOSCOPY  08/2002   negative  . OOPHORECTOMY      Social History   Social History  . Marital status: Married    Spouse name: [name]  . Number of children: 1  . Years of education: N/A   Occupational History  . works partime     [employer]   Social History Main Topics  . Smoking status: Never Smoker  . Smokeless tobacco: Not on file  . Alcohol use Yes  . Drug use: No  . Sexual activity: Yes    Partners: Male   Other Topics Concern  . Not on file   Social History Narrative  . No narrative on file    Family History  Problem Relation Age of Onset  . Heart disease Mother   . Diabetes Mother   . COPD Father     smoker  . Stroke Brother 75  . Parkinsonism Brother     Outpatient Encounter Prescriptions as  of 09/20/2016  Medication Sig  . calcium carbonate (OS-CAL) 1250 MG tablet Take 1 tablet by mouth 2 (two) times daily.    . fish oil-omega-3 fatty acids 1000 MG capsule Take 1 g by mouth 3 (three) times daily.    Marland Kitchen LORazepam (ATIVAN) 1 MG tablet Take 0.5 tablets (0.5 mg total) by mouth 2 (two) times daily as needed for anxiety. FAX: [phone]  . Multiple Vitamin (MULTIVITAMIN) tablet Take 1 tablet by mouth daily.    . Vitamin D, Cholecalciferol, 400 UNITS TABS Take 1 tablet by mouth daily.  . [DISCONTINUED] Red Yeast Rice Extract (RED YEAST RICE PO) Take 1 capsule by mouth.   No facility-administered encounter medications on file as of 09/20/2016.          Objective:   Physical Exam  Constitutional: She is oriented to person, place, and time. She appears well-developed and well-nourished.  HENT:  Head: Normocephalic and atraumatic.  Right Ear: External ear normal.  Left Ear: External ear normal.  Nose: Nose normal.  Mouth/Throat: Oropharynx is clear and moist.  Right TM and canal is clear.  Left canal is blocked by cerumen.  Eyes: Conjunctivae and EOM are normal. Pupils are equal, round, and reactive to light.  Neck: Neck supple. No thyromegaly present.  Cardiovascular: Normal rate, regular rhythm and normal heart sounds.   Pulmonary/Chest: Effort normal and breath sounds normal. She has no wheezes.  Lymphadenopathy:    She has no cervical adenopathy.  Neurological: She is alert and oriented to person, place, and time.  Skin: Skin is warm and dry.  Psychiatric: She has a normal mood and affect.          Assessment & Plan:  Left cerumen impaction-causing acute hearing loss.  Irrigation performed and patient tolerated well.  White coat Hypertension-blood pressure uncontrolled today. Unfortunately it typically is is high when she comes in. She's can check her blood pressure later today when she gets home and then let us know what her readings are. She is completely asymptomatic  without any chest pain shortness breath headaches or vision changes.  Indication: Cerumen impaction of the ear(s) Medical necessity statement: On physical examination, cerumen impairs clinically significant portions of the external auditory canal, and tympanic membrane. Noted obstructive, copious cerumen that cannot be removed without magnification and instrumentations requiring clinical skills Consent: Discussed benefits and risks of procedure and verbal consent obtained Procedure: Patient was prepped for the procedure. Utilized an otoscope to assess and take note of the ear canal, the tympanic membrane, and the presence, amount, and placement of the cerumen. Gentle water irrigation utilized to remove cerumen.  Post procedure examination: shows cerumen was completely removed. Patient tolerated procedure well. The patient is made aware that they may experience temporary vertigo, temporary hearing loss, and temporary discomfort. If these symptom last for more than 24 hours to call the clinic or proceed to the ED.

## 2016-11-01 ENCOUNTER — Other Ambulatory Visit: Payer: Self-pay | Admitting: Family Medicine

## 2016-11-02 ENCOUNTER — Encounter: Payer: Self-pay | Admitting: Family Medicine

## 2016-11-02 ENCOUNTER — Ambulatory Visit (INDEPENDENT_AMBULATORY_CARE_PROVIDER_SITE_OTHER): Payer: Medicare Other | Admitting: Family Medicine

## 2016-11-02 VITALS — BP 184/65 | HR 112 | Ht 64.0 in | Wt 152.0 lb

## 2016-11-02 DIAGNOSIS — R030 Elevated blood-pressure reading, without diagnosis of hypertension: Secondary | ICD-10-CM

## 2016-11-02 DIAGNOSIS — F411 Generalized anxiety disorder: Secondary | ICD-10-CM

## 2016-11-02 MED ORDER — LORAZEPAM 1 MG PO TABS: 0.5000 mg | ORAL_TABLET | Freq: Two times a day (BID) | ORAL | 1 refills | Status: DC | PRN

## 2016-11-02 MED ORDER — LORAZEPAM 1 MG PO TABS: 0.5000 mg | ORAL_TABLET | Freq: Two times a day (BID) | ORAL | 4 refills | Status: DC | PRN

## 2016-11-02 NOTE — Progress Notes (Signed)
Subjective:    CC: HTN  HPI: White Coat Hypertension- Pt denies chest pain, SOB, dizziness, or heart palpitations.   Denies medication side effects.  Brought in log of home BPs that look great.    Anxiety-she would like a refill on her alprazolam. She is a little frustrated because she actually caught her pharmacy on Monday to request refills. By Tuesday they said they had not heard from our office. She says she then called our office on Tuesday and Wednesday and left messages on both days and finally on Thursday was able to speak to our triage nurse for refill.  Past medical history, Surgical history, Family history not pertinant except as noted below, Social history, Allergies, and medications have been entered into the medical record, reviewed, and corrections made.   Review of Systems: No fevers, chills, night sweats, weight loss, chest pain, or shortness of breath.   Objective:    General: Well Developed, well nourished, and in no acute distress.  Neuro: Alert and oriented x3, extra-ocular muscles intact, sensation grossly intact.  HEENT: Normocephalic, atraumatic  Skin: Warm and dry, no rashes. Cardiac: Regular rate and rhythm, no murmurs rubs or gallops, no lower extremity edema.  Respiratory: Clear to auscultation bilaterally. Not using accessory muscles, speaking in full sentences.   Impression and Recommendations:    Whitecoat hypertension-blood pressure little bit elevated today because she's feeling a little anxious. We'll continue to monitor carefully. Home blood pressures at goal.  ANxiety-will refill lorazepam. Continue to use sparingly. Most a she only takes it once a day and usually splits the medication. F/U in 6 months.

## 2017-01-01 ENCOUNTER — Ambulatory Visit: Payer: Medicare Other | Admitting: Family Medicine

## 2017-04-30 ENCOUNTER — Telehealth: Payer: Self-pay

## 2017-04-30 DIAGNOSIS — Z0000 Encounter for general adult medical examination without abnormal findings: Secondary | ICD-10-CM

## 2017-04-30 NOTE — Telephone Encounter (Signed)
Patient called stated that she has an appointment on next week for a CPE and she is requesting labs ahead of time. Labs have been ordered below please add anything else that is not shown that you would like to add. Kimberly Durham,CMA

## 2017-04-30 NOTE — Telephone Encounter (Signed)
Perfect, labs printed.

## 2017-05-01 LAB — BASIC METABOLIC PANEL
BUN: 20 (ref 4–21)
CREATININE: 1 (ref 0.5–1.1)
GLUCOSE: 102
Potassium: 4.7 (ref 3.4–5.3)
Sodium: 139 (ref 137–147)

## 2017-05-01 LAB — CBC AND DIFFERENTIAL
HCT: 39 (ref 36–46)
Hemoglobin: 13.3 (ref 12.0–16.0)
Neutrophils Absolute: 3868
PLATELETS: 327 (ref 150–399)
WBC: 8.5

## 2017-05-01 LAB — HEPATIC FUNCTION PANEL
ALK PHOS: 70 (ref 25–125)
ALT: 15 (ref 7–35)
AST: 20 (ref 13–35)
BILIRUBIN, TOTAL: 0.8
Bilirubin, Direct: 0.1 (ref 0.01–0.4)

## 2017-05-02 LAB — HEPATIC FUNCTION PANEL
AG Ratio: 1.2 (calc) (ref 1.0–2.5)
ALKALINE PHOSPHATASE (APISO): 70 U/L (ref 33–130)
ALT: 15 U/L (ref 6–29)
AST: 20 U/L (ref 10–35)
Albumin: 4 g/dL (ref 3.6–5.1)
BILIRUBIN TOTAL: 0.8 mg/dL (ref 0.2–1.2)
Bilirubin, Direct: 0.1 mg/dL (ref 0.0–0.2)
Globulin: 3.3 g/dL (calc) (ref 1.9–3.7)
Indirect Bilirubin: 0.7 mg/dL (calc) (ref 0.2–1.2)
TOTAL PROTEIN: 7.3 g/dL (ref 6.1–8.1)

## 2017-05-02 LAB — COMPLETE METABOLIC PANEL WITH GFR
AG RATIO: 1.2 (calc) (ref 1.0–2.5)
ALKALINE PHOSPHATASE (APISO): 70 U/L (ref 33–130)
ALT: 15 U/L (ref 6–29)
AST: 20 U/L (ref 10–35)
Albumin: 4 g/dL (ref 3.6–5.1)
BILIRUBIN TOTAL: 0.8 mg/dL (ref 0.2–1.2)
BUN/Creatinine Ratio: 20 (calc) (ref 6–22)
BUN: 20 mg/dL (ref 7–25)
CHLORIDE: 102 mmol/L (ref 98–110)
CO2: 20 mmol/L (ref 20–32)
Calcium: 9.7 mg/dL (ref 8.6–10.4)
Creat: 1 mg/dL — ABNORMAL HIGH (ref 0.60–0.93)
GFR, EST NON AFRICAN AMERICAN: 54 mL/min/{1.73_m2} — AB (ref >=60)
GFR, Est African American: 62 mL/min/{1.73_m2} (ref >=60)
GLOBULIN: 3.3 g/dL (ref 1.9–3.7)
Glucose, Bld: 102 mg/dL — ABNORMAL HIGH (ref 65–99)
POTASSIUM: 4.7 mmol/L (ref 3.5–5.3)
SODIUM: 139 mmol/L (ref 135–146)
Total Protein: 7.3 g/dL (ref 6.1–8.1)

## 2017-05-02 LAB — CBC WITH DIFFERENTIAL/PLATELET
BASOS PCT: 0.8 %
Basophils Absolute: 68 cells/uL (ref 0–200)
EOS PCT: 4.9 %
Eosinophils Absolute: 417 cells/uL (ref 15–500)
HCT: 39.4 % (ref 35.0–45.0)
HEMOGLOBIN: 13.3 g/dL (ref 11.7–15.5)
Lymphs Abs: 3069 cells/uL (ref 850–3900)
MCH: 30.7 pg (ref 27.0–33.0)
MCHC: 33.8 g/dL (ref 32.0–36.0)
MCV: 91 fL (ref 80.0–100.0)
MONOS PCT: 12.7 %
MPV: 10.5 fL (ref 7.5–12.5)
NEUTROS ABS: 3868 {cells}/uL (ref 1500–7800)
Neutrophils Relative %: 45.5 %
PLATELETS: 327 10*3/uL (ref 140–400)
RBC: 4.33 10*6/uL (ref 3.80–5.10)
RDW: 12.6 % (ref 11.0–15.0)
TOTAL LYMPHOCYTE: 36.1 %
WBC mixed population: 1080 cells/uL — ABNORMAL HIGH (ref 200–950)
WBC: 8.5 10*3/uL (ref 3.8–10.8)

## 2017-05-02 LAB — TEST AUTHORIZATION

## 2017-05-02 LAB — LIPID PANEL
CHOL/HDL RATIO: 2.6 (calc) (ref <5.0)
Cholesterol: 282 mg/dL — ABNORMAL HIGH (ref <200)
HDL: 110 mg/dL (ref >50)
LDL Cholesterol (Calc): 141 mg/dL (calc) — ABNORMAL HIGH
NON-HDL CHOLESTEROL (CALC): 172 mg/dL — AB (ref <130)
Triglycerides: 172 mg/dL — ABNORMAL HIGH (ref <150)

## 2017-05-07 ENCOUNTER — Ambulatory Visit (INDEPENDENT_AMBULATORY_CARE_PROVIDER_SITE_OTHER): Payer: Medicare Other | Admitting: Family Medicine

## 2017-05-07 ENCOUNTER — Encounter: Payer: Self-pay | Admitting: Family Medicine

## 2017-05-07 VITALS — BP 142/68 | HR 93 | Ht 64.0 in | Wt 153.0 lb

## 2017-05-07 DIAGNOSIS — N183 Chronic kidney disease, stage 3 (moderate): Secondary | ICD-10-CM

## 2017-05-07 DIAGNOSIS — F411 Generalized anxiety disorder: Secondary | ICD-10-CM

## 2017-05-07 DIAGNOSIS — R030 Elevated blood-pressure reading, without diagnosis of hypertension: Secondary | ICD-10-CM | POA: Diagnosis not present

## 2017-05-07 DIAGNOSIS — N1832 Chronic kidney disease, stage 3b: Secondary | ICD-10-CM

## 2017-05-07 DIAGNOSIS — Z23 Encounter for immunization: Secondary | ICD-10-CM | POA: Diagnosis not present

## 2017-05-07 MED ORDER — LORAZEPAM 1 MG PO TABS: 0.5000 mg | ORAL_TABLET | Freq: Two times a day (BID) | ORAL | 4 refills | Status: DC | PRN

## 2017-05-07 NOTE — Progress Notes (Signed)
Subjective:    CC: Blood pressure check.  HPI:  White coat Hypertension- Pt denies chest pain, SOB, dizziness, or heart palpitations.   Home blood pressure log with her today. Blood pressures look fantastic mostly in the 120s and a few in the low 130s with pulse mostly in the 60s. She also had lab work done last week and would like to go over those results today.  Wound care.  Lesion removed off her left shin. She's had difficulty with healing but has been going to wound care. She's been doing some vinegar soaks a couple times starting last Friday and that has made a big difference. She says is still draining a little but overall looks much better.  CKD 3-no recent changes. She's not on any medications that have to be renally dosed. Lab Results  Component Value Date   CREATININE 0.99 (H) 06/26/2016   BUN 19 06/26/2016   NA 136 06/26/2016   K 4.5 06/26/2016   CL 100 06/26/2016   CO2 27 06/26/2016    Anxiety-she is requesting a refill on her lorazepam today.  Past medical history, Surgical history, Family history not pertinant except as noted below, Social history, Allergies, and medications have been entered into the medical record, reviewed, and corrections made.   Review of Systems: No fevers, chills, night sweats, weight loss, chest pain, or shortness of breath.   Objective:    General: Well Developed, well nourished, and in no acute distress.  Neuro: Alert and oriented x3, extra-ocular muscles intact, sensation grossly intact.  HEENT: Normocephalic, atraumatic  Skin: Warm and dry, no rashes. Cardiac: Regular rate and rhythm, no murmurs rubs or gallops, no lower extremity edema.  Respiratory: Clear to auscultation bilaterally. Not using accessory muscles, speaking in full sentences.   Impression and Recommendations:    White coat HTN - Home blood pressure log looks fantastic. Follow-up in 6 months.  CKD 3-due to recheck renal function. Recommend check every 6 months if  possible.  Wound care-she was encouraged to get some compression stockings. Recommended [facility] as they do have some of them on the shelf which are easy to find and using a reasonably priced.  Anxiety-we'll refill lorazepam today.

## 2017-05-08 ENCOUNTER — Other Ambulatory Visit: Payer: Self-pay | Admitting: Family Medicine

## 2017-05-08 ENCOUNTER — Telehealth: Payer: Self-pay | Admitting: Family Medicine

## 2017-05-08 DIAGNOSIS — N1832 Chronic kidney disease, stage 3b: Secondary | ICD-10-CM

## 2017-05-08 DIAGNOSIS — E875 Hyperkalemia: Secondary | ICD-10-CM

## 2017-05-08 DIAGNOSIS — N183 Chronic kidney disease, stage 3 (moderate): Principal | ICD-10-CM

## 2017-05-08 LAB — COMPLETE METABOLIC PANEL WITH GFR
AG Ratio: 1.4 (calc) (ref 1.0–2.5)
ALKALINE PHOSPHATASE (APISO): 68 U/L (ref 33–130)
ALT: 16 U/L (ref 6–29)
AST: 19 U/L (ref 10–35)
Albumin: 4.2 g/dL (ref 3.6–5.1)
BILIRUBIN TOTAL: 0.7 mg/dL (ref 0.2–1.2)
BUN/Creatinine Ratio: 23 (calc) — ABNORMAL HIGH (ref 6–22)
BUN: 23 mg/dL (ref 7–25)
CHLORIDE: 100 mmol/L (ref 98–110)
CO2: 32 mmol/L (ref 20–32)
Calcium: 9.7 mg/dL (ref 8.6–10.4)
Creat: 1.02 mg/dL — ABNORMAL HIGH (ref 0.60–0.93)
GFR, Est African American: 61 mL/min/{1.73_m2} (ref >=60)
GFR, Est Non African American: 52 mL/min/{1.73_m2} — ABNORMAL LOW (ref >=60)
GLUCOSE: 98 mg/dL (ref 65–99)
Globulin: 2.9 g/dL (calc) (ref 1.9–3.7)
Potassium: 4.7 mmol/L (ref 3.5–5.3)
Sodium: 138 mmol/L (ref 135–146)
TOTAL PROTEIN: 7.1 g/dL (ref 6.1–8.1)

## 2017-05-08 NOTE — Progress Notes (Signed)
Lab ordered per Pt request

## 2017-05-08 NOTE — Telephone Encounter (Signed)
[facility] faxed  over the results with the metabolic panel.Creatinine was right around 1.0 and electrolytes were normal. Liver enzymes were normal as well and full hepatic function including bilirubin was normal.

## 2017-05-08 NOTE — Telephone Encounter (Signed)
Patient notified and voiced understanding.

## 2017-05-10 ENCOUNTER — Encounter: Payer: Self-pay | Admitting: Family Medicine

## 2017-07-29 ENCOUNTER — Encounter: Payer: Self-pay | Admitting: Family Medicine

## 2017-08-06 NOTE — Telephone Encounter (Signed)
I called [facility] this morning at [phone] and spoke with Tomeka.  I gave her 4 new diagnosis codes to add to the labs drawn in October.  When the labs were sent in , they were only given the diagnosis code Z00.00 which is a physical and Medicare does not pay any lab with the Z00.00 code.  You will need to allow 30 to 45 days for reprocessing.

## 2017-11-01 ENCOUNTER — Encounter: Payer: Self-pay | Admitting: Family Medicine

## 2017-11-01 DIAGNOSIS — N183 Chronic kidney disease, stage 3 (moderate): Principal | ICD-10-CM

## 2017-11-01 DIAGNOSIS — N1832 Chronic kidney disease, stage 3b: Secondary | ICD-10-CM

## 2017-11-04 LAB — COMPLETE METABOLIC PANEL WITH GFR
AG Ratio: 1.4 (calc) (ref 1.0–2.5)
ALKALINE PHOSPHATASE (APISO): 71 U/L (ref 33–130)
ALT: 15 U/L (ref 6–29)
AST: 20 U/L (ref 10–35)
Albumin: 4.3 g/dL (ref 3.6–5.1)
BUN/Creatinine Ratio: 21 (calc) (ref 6–22)
BUN: 21 mg/dL (ref 7–25)
CO2: 28 mmol/L (ref 20–32)
CREATININE: 1 mg/dL — AB (ref 0.60–0.93)
Calcium: 9.7 mg/dL (ref 8.6–10.4)
Chloride: 102 mmol/L (ref 98–110)
GFR, Est African American: 62 mL/min/{1.73_m2} (ref >=60)
GFR, Est Non African American: 54 mL/min/{1.73_m2} — ABNORMAL LOW (ref >=60)
GLOBULIN: 3.1 g/dL (ref 1.9–3.7)
Glucose, Bld: 93 mg/dL (ref 65–99)
Potassium: 4.4 mmol/L (ref 3.5–5.3)
SODIUM: 137 mmol/L (ref 135–146)
Total Bilirubin: 0.7 mg/dL (ref 0.2–1.2)
Total Protein: 7.4 g/dL (ref 6.1–8.1)

## 2017-11-05 ENCOUNTER — Ambulatory Visit (INDEPENDENT_AMBULATORY_CARE_PROVIDER_SITE_OTHER): Payer: Medicare Other | Admitting: Family Medicine

## 2017-11-05 ENCOUNTER — Encounter: Payer: Self-pay | Admitting: Family Medicine

## 2017-11-05 VITALS — BP 136/78 | HR 74 | Ht 64.0 in | Wt 155.0 lb

## 2017-11-05 DIAGNOSIS — N183 Chronic kidney disease, stage 3 (moderate): Secondary | ICD-10-CM | POA: Diagnosis not present

## 2017-11-05 DIAGNOSIS — N1832 Chronic kidney disease, stage 3b: Secondary | ICD-10-CM

## 2017-11-05 DIAGNOSIS — F411 Generalized anxiety disorder: Secondary | ICD-10-CM | POA: Diagnosis not present

## 2017-11-05 DIAGNOSIS — R030 Elevated blood-pressure reading, without diagnosis of hypertension: Secondary | ICD-10-CM

## 2017-11-05 MED ORDER — LORAZEPAM 1 MG PO TABS: 0.5000 mg | ORAL_TABLET | Freq: Two times a day (BID) | ORAL | 4 refills | Status: DC | PRN

## 2017-11-05 NOTE — Progress Notes (Signed)
Subjective:    CC: Check kidneys and blood pressure.    HPI:  F/U CKD - doing well overall. Went for labs yesterday.  No recent changes  19-month follow-up whitecoat hypertension-she has been tracking her blood pressures at home and brought in her log with her today.  Most of the blood pressures running in the 120s and 130s but she did have a couple of blood pressures in the 150s and 170s.  Pulse primarily staying in the 60s-70s.  Anxiety -due for refill on her Lorazepam.   Past medical history, Surgical history, Family history not pertinant except as noted below, Social history, Allergies, and medications have been entered into the medical record, reviewed, and corrections made.   Review of Systems: No fevers, chills, night sweats, weight loss, chest pain, or shortness of breath.   Objective:    General: Well Developed, well nourished, and in no acute distress.  Neuro: Alert and oriented x3, extra-ocular muscles intact, sensation grossly intact.  HEENT: Normocephalic, atraumatic  Skin: Warm and dry, no rashes. Cardiac: Regular rate and rhythm, no murmurs rubs or gallops, no lower extremity edema.  Respiratory: Clear to auscultation bilaterally. Not using accessory muscles, speaking in full sentences.   Impression and Recommendations:    CKD 3- stable - Creatinine is stable. F/U in 6months.  Pan for urine microablumin next time.   White coat HTN - home BP log looks great! Will continue to monitor.    Anxiety -overall doing well.  Refilled her ATivan.

## 2017-12-02 IMAGING — CT CT ABD-PELV W/ CM
2 of 5 series · 16 of 46 positions shown, 18 images · IV contrast (APPLIED)
Comparison: None.

CLINICAL DATA: Constipation, bloating following recent antibiotic
therapy. Previous hysterectomy.

EXAM:
CT ABDOMEN AND PELVIS WITH CONTRAST
TECHNIQUE: Multidetector CT imaging of the abdomen and pelvis was performed
using the standard protocol following bolus administration of
intravenous contrast.
CONTRAST:  100mL OMNIPAQUE IOHEXOL 300 MG/ML SOLN, 25mL OMNIPAQUE
IOHEXOL 300 MG/ML SOLN

[Series 2: axial st · axial · 0.74mm/px · z∈[-504,-168]mm · 13 of 75 slices shown, 15 images]
[im 4/75  soft-tissue]
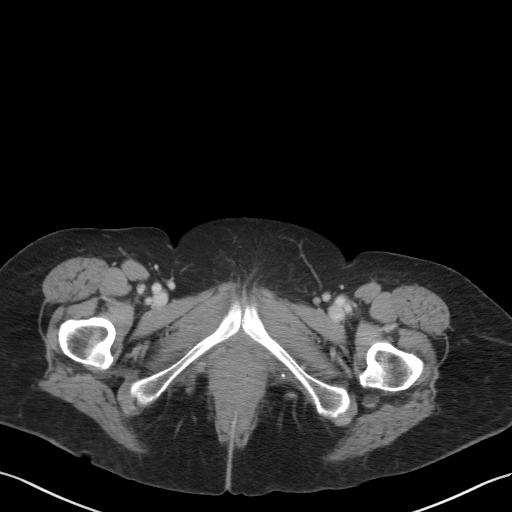
[im 4/75  bone]
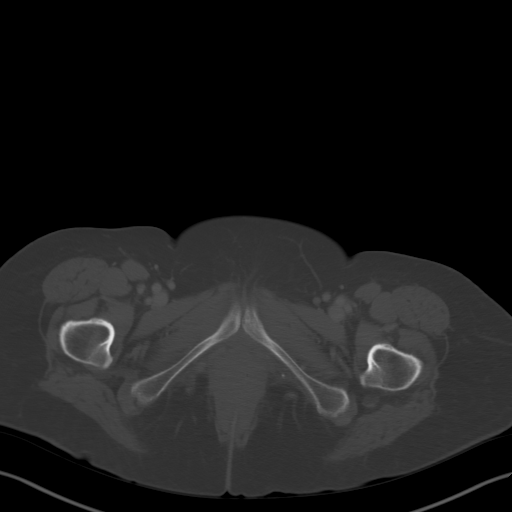
[im 12/75  soft-tissue]
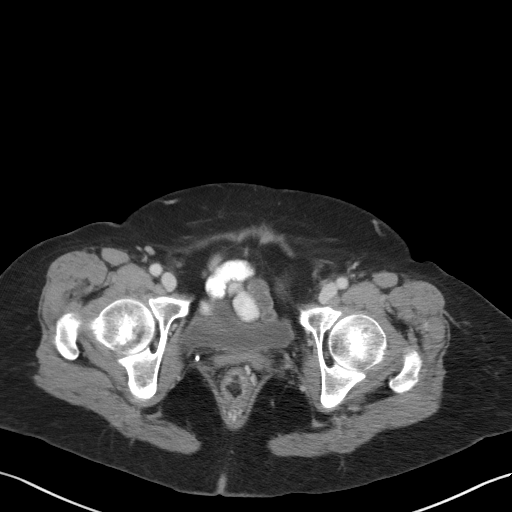
[im 16/75  soft-tissue]
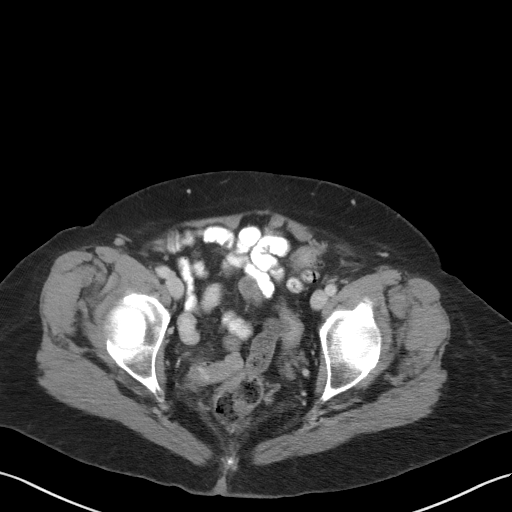
[im 20/75  soft-tissue]
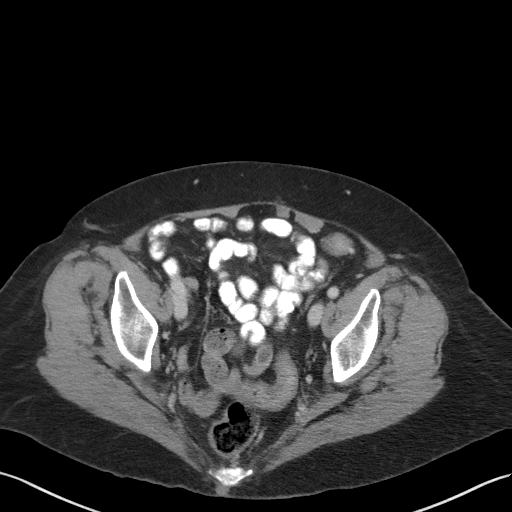
[im 28/75  soft-tissue]
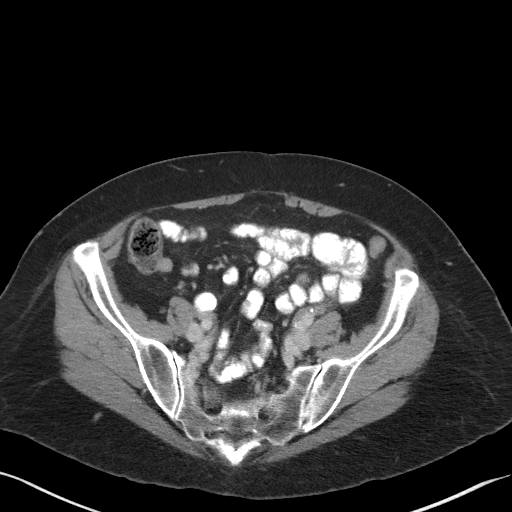
[im 32/75  soft-tissue]
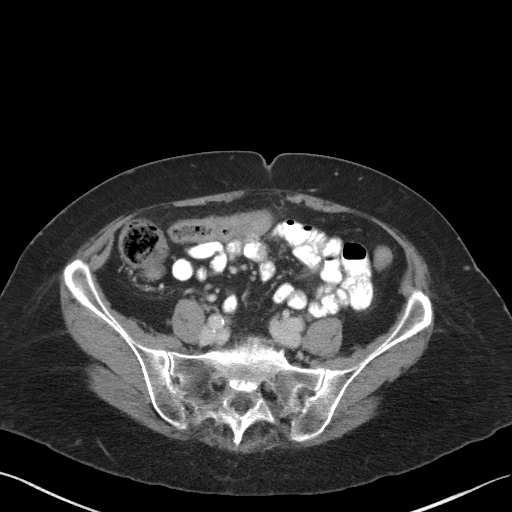
[im 39/75  soft-tissue]
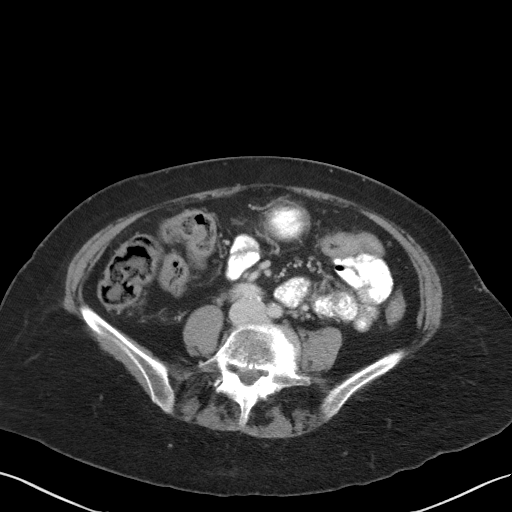
[im 43/75  soft-tissue]
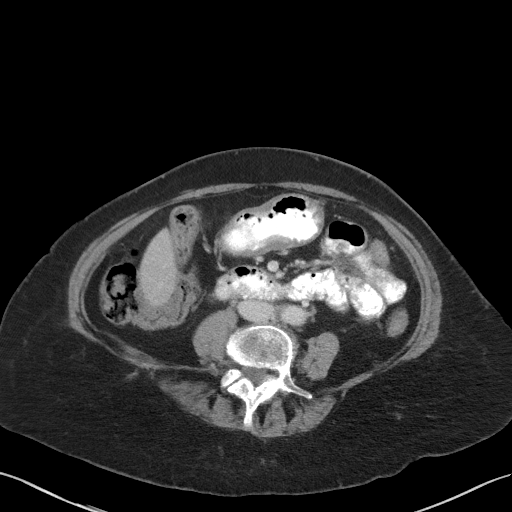
[im 47/75  soft-tissue]
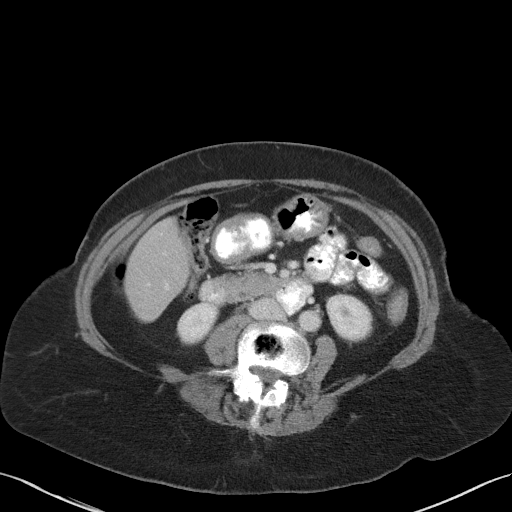
[im 47/75  bone]
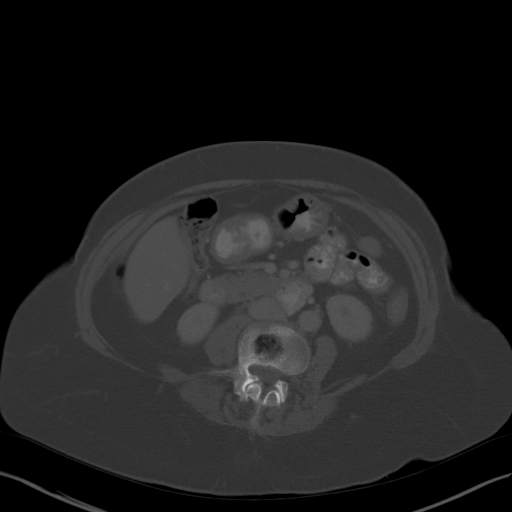
[im 55/75  soft-tissue]
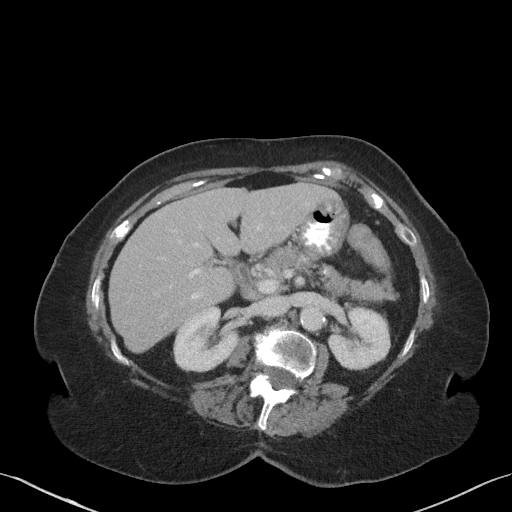
[im 59/75  soft-tissue]
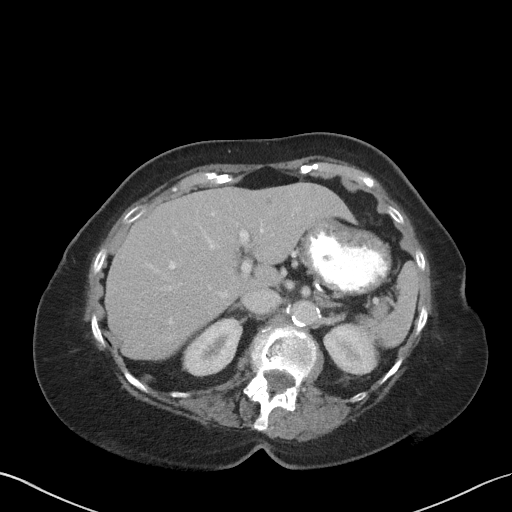
[im 63/75  soft-tissue]
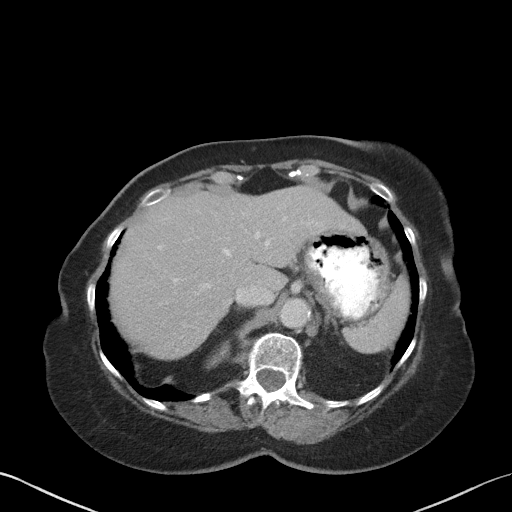
[im 71/75  soft-tissue]
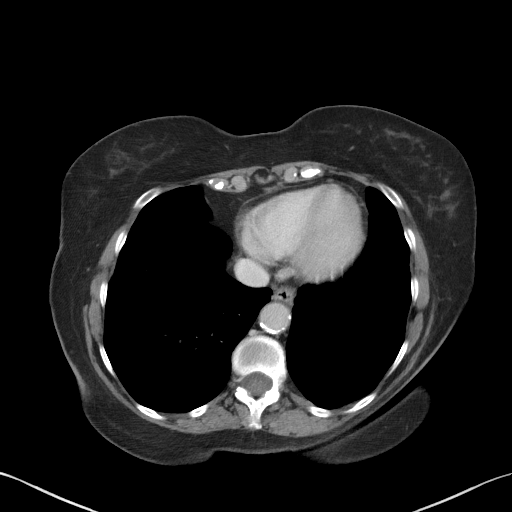

[Series 5: coronal st · coronal · 0.75mm/px · 3 of 101 slices shown]
[im 34/101  soft-tissue]
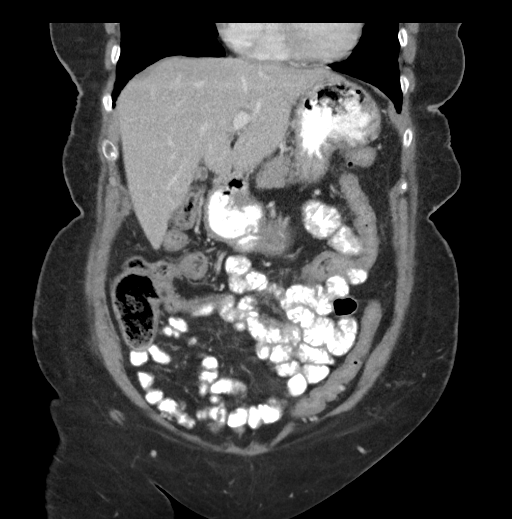
[im 45/101  soft-tissue]
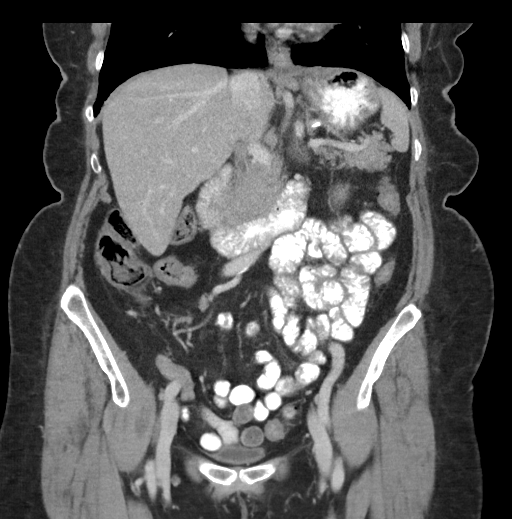
[im 56/101  soft-tissue]
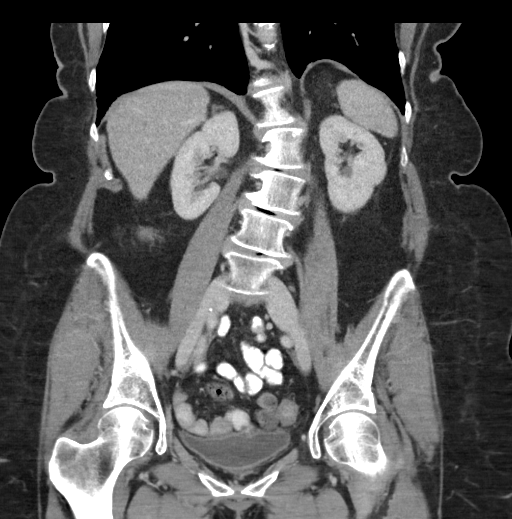

[16 of 46 positions shown; findings below may reference images not displayed]

FINDINGS: Lower chest: Mild subpleural reticulation in both lung bases. No
airspace disease, pleural or pericardial effusion. There is aortic
atherosclerosis.

Hepatobiliary: Probable small cyst in the left hepatic lobe. No
suspicious hepatic findings. No evidence of gallstones, gallbladder
wall thickening or biliary dilatation. The gallbladder is
incompletely distended.

Pancreas: Unremarkable. No pancreatic ductal dilatation or
surrounding inflammatory changes.

Spleen: Normal in size without focal abnormality.

Adrenals/Urinary Tract: Both adrenal glands appear normal. The
kidneys appear normal without evidence of urinary tract calculus,
suspicious lesion or hydronephrosis. No bladder abnormalities are
seen.

Stomach/Bowel: No evidence of bowel wall thickening, distention or
surrounding inflammatory change. Colonic stool burden does not
appear significantly increased. There are scattered distal colonic
diverticular changes. Appendix not visualized.

Vascular/Lymphatic: There are no enlarged abdominal or pelvic lymph
nodes. Mild aortic and branch vessel atherosclerosis and tortuosity.

Reproductive: Hysterectomy. No evidence of adnexal mass. Multiple
pelvic phleboliths.

Other: No evidence of abdominal wall mass or hernia.  No ascites.

Musculoskeletal: No acute or significant osseous findings. There are
degenerative changes throughout the lumbar spine associate with a
convex left scoliosis.
IMPRESSION: 1. No acute findings or explanation for the patient's symptoms.
2. Colonic stool burden does not appear significantly increased. No
inflammatory changes are identified.
3. Atherosclerosis and postsurgical changes as described.
4. Lumbar spondylosis and convex left scoliosis.

## 2017-12-13 ENCOUNTER — Other Ambulatory Visit: Payer: Self-pay | Admitting: Family Medicine

## 2017-12-13 NOTE — Telephone Encounter (Signed)
Checked pt in database and she last filled on 10/23/17 #30. Routed to pcp for signature.Laureen Ochs, Viann Shove, CMA

## 2018-03-10 ENCOUNTER — Encounter: Payer: Self-pay | Admitting: Family Medicine

## 2018-03-19 ENCOUNTER — Telehealth: Payer: Self-pay | Admitting: Family Medicine

## 2018-03-19 DIAGNOSIS — N1832 Chronic kidney disease, stage 3b: Secondary | ICD-10-CM

## 2018-03-19 DIAGNOSIS — Z780 Asymptomatic menopausal state: Secondary | ICD-10-CM

## 2018-03-19 DIAGNOSIS — R030 Elevated blood-pressure reading, without diagnosis of hypertension: Secondary | ICD-10-CM

## 2018-03-19 DIAGNOSIS — Z0000 Encounter for general adult medical examination without abnormal findings: Secondary | ICD-10-CM

## 2018-03-19 DIAGNOSIS — N183 Chronic kidney disease, stage 3 unspecified: Secondary | ICD-10-CM

## 2018-03-19 DIAGNOSIS — Z13820 Encounter for screening for osteoporosis: Secondary | ICD-10-CM

## 2018-03-19 DIAGNOSIS — M810 Age-related osteoporosis without current pathological fracture: Secondary | ICD-10-CM

## 2018-03-19 DIAGNOSIS — I700 Atherosclerosis of aorta: Secondary | ICD-10-CM

## 2018-03-19 NOTE — Telephone Encounter (Signed)
Call pt: her last bone density was in 2013 so she is due for rpeat bone density.  Based on her last beon density she may want to consider starting a bone builder like Fosamax.

## 2018-03-20 NOTE — Telephone Encounter (Signed)
Orders signed.

## 2018-03-20 NOTE — Telephone Encounter (Signed)
Spoke with Pt, she would like to get bone density done around the time of her annual exam. Also requested labs be ordered so she can complete them prior. Orders pended for PCP.

## 2018-03-21 NOTE — Telephone Encounter (Signed)
Pt advised.Kimberly Durham Kimberly Durham  

## 2018-04-09 ENCOUNTER — Ambulatory Visit (INDEPENDENT_AMBULATORY_CARE_PROVIDER_SITE_OTHER): Payer: Medicare Other

## 2018-04-09 DIAGNOSIS — Z780 Asymptomatic menopausal state: Secondary | ICD-10-CM

## 2018-04-09 DIAGNOSIS — M810 Age-related osteoporosis without current pathological fracture: Secondary | ICD-10-CM

## 2018-04-09 DIAGNOSIS — Z13820 Encounter for screening for osteoporosis: Secondary | ICD-10-CM

## 2018-04-09 LAB — COMPLETE METABOLIC PANEL WITH GFR
AG RATIO: 1.6 (calc) (ref 1.0–2.5)
ALKALINE PHOSPHATASE (APISO): 62 U/L (ref 33–130)
ALT: 20 U/L (ref 6–29)
AST: 21 U/L (ref 10–35)
Albumin: 4.5 g/dL (ref 3.6–5.1)
BILIRUBIN TOTAL: 1 mg/dL (ref 0.2–1.2)
BUN / CREAT RATIO: 21 (calc) (ref 6–22)
BUN: 21 mg/dL (ref 7–25)
CO2: 24 mmol/L (ref 20–32)
CREATININE: 1.01 mg/dL — AB (ref 0.60–0.88)
Calcium: 10.2 mg/dL (ref 8.6–10.4)
Chloride: 102 mmol/L (ref 98–110)
GFR, EST AFRICAN AMERICAN: 61 mL/min/{1.73_m2} (ref >=60)
GFR, EST NON AFRICAN AMERICAN: 53 mL/min/{1.73_m2} — AB (ref >=60)
Globulin: 2.9 g/dL (calc) (ref 1.9–3.7)
Glucose, Bld: 105 mg/dL — ABNORMAL HIGH (ref 65–99)
Potassium: 4.5 mmol/L (ref 3.5–5.3)
SODIUM: 138 mmol/L (ref 135–146)
TOTAL PROTEIN: 7.4 g/dL (ref 6.1–8.1)

## 2018-04-09 LAB — CBC WITH DIFFERENTIAL/PLATELET
BASOS ABS: 81 {cells}/uL (ref 0–200)
Basophils Relative: 1 %
EOS ABS: 389 {cells}/uL (ref 15–500)
Eosinophils Relative: 4.8 %
HCT: 40.7 % (ref 35.0–45.0)
Hemoglobin: 13.8 g/dL (ref 11.7–15.5)
Lymphs Abs: 2957 cells/uL (ref 850–3900)
MCH: 30.4 pg (ref 27.0–33.0)
MCHC: 33.9 g/dL (ref 32.0–36.0)
MCV: 89.6 fL (ref 80.0–100.0)
MONOS PCT: 11.9 %
MPV: 10.5 fL (ref 7.5–12.5)
NEUTROS PCT: 45.8 %
Neutro Abs: 3710 cells/uL (ref 1500–7800)
PLATELETS: 347 10*3/uL (ref 140–400)
RBC: 4.54 10*6/uL (ref 3.80–5.10)
RDW: 12.4 % (ref 11.0–15.0)
TOTAL LYMPHOCYTE: 36.5 %
WBC: 8.1 10*3/uL (ref 3.8–10.8)
WBCMIX: 964 {cells}/uL — AB (ref 200–950)

## 2018-04-09 LAB — LIPID PANEL
CHOLESTEROL: 279 mg/dL — AB (ref <200)
HDL: 100 mg/dL (ref >50)
LDL CHOLESTEROL (CALC): 148 mg/dL — AB
NON-HDL CHOLESTEROL (CALC): 179 mg/dL — AB (ref <130)
Total CHOL/HDL Ratio: 2.8 (calc) (ref <5.0)
Triglycerides: 170 mg/dL — ABNORMAL HIGH (ref <150)

## 2018-05-07 ENCOUNTER — Ambulatory Visit (INDEPENDENT_AMBULATORY_CARE_PROVIDER_SITE_OTHER): Payer: Medicare Other | Admitting: Family Medicine

## 2018-05-07 ENCOUNTER — Encounter: Payer: Self-pay | Admitting: Family Medicine

## 2018-05-07 VITALS — BP 136/68 | HR 63 | Ht 64.0 in | Wt 152.0 lb

## 2018-05-07 DIAGNOSIS — R7309 Other abnormal glucose: Secondary | ICD-10-CM

## 2018-05-07 DIAGNOSIS — R030 Elevated blood-pressure reading, without diagnosis of hypertension: Secondary | ICD-10-CM

## 2018-05-07 DIAGNOSIS — F411 Generalized anxiety disorder: Secondary | ICD-10-CM

## 2018-05-07 DIAGNOSIS — N183 Chronic kidney disease, stage 3 (moderate): Secondary | ICD-10-CM

## 2018-05-07 DIAGNOSIS — Z23 Encounter for immunization: Secondary | ICD-10-CM

## 2018-05-07 DIAGNOSIS — N1832 Chronic kidney disease, stage 3b: Secondary | ICD-10-CM

## 2018-05-07 DIAGNOSIS — R7301 Impaired fasting glucose: Secondary | ICD-10-CM

## 2018-05-07 LAB — POCT GLYCOSYLATED HEMOGLOBIN (HGB A1C): HEMOGLOBIN A1C: 5.8 % — AB (ref 4.0–5.6)

## 2018-05-07 NOTE — Progress Notes (Signed)
Subjective:    CC: BP check and kidneys  HPI:  White coat Hypertension- Pt denies chest pain, SOB, dizziness, or heart palpitations. She is exercising regularly and eating healthy but has gained some weight but she admits she was eating a lot of sweets and ice cream over the summer.  CKD 3 - Stable. No new sxs.   Anxiety -well overall.  She tries to use her lorazepam sparingly.  She says she still has a refill so does not need one sent today but will likely call in December for refill  She walks between 5000-10000 step per day.   Past medical history, Surgical history, Family history not pertinant except as noted below, Social history, Allergies, and medications have been entered into the medical record, reviewed, and corrections made.   Review of Systems: No fevers, chills, night sweats, weight loss, chest pain, or shortness of breath.   Objective:    General: Well Developed, well nourished, and in no acute distress.  Neuro: Alert and oriented x3, extra-ocular muscles intact, sensation grossly intact.  HEENT: Normocephalic, atraumatic  Skin: Warm and dry, no rashes. Cardiac: Regular rate and rhythm, no murmurs rubs or gallops, no lower extremity edema.  Respiratory: Clear to auscultation bilaterally. Not using accessory muscles, speaking in full sentences.   Impression and Recommendations:    Whitecoat hypertension-she did bring in the log of her home blood pressures.  They all look great except 1 pressure was just mildly elevated at 142/70.  But the rest of them were at goal.  Again we will just continue to monitor.  Did encourage her to work on weight loss.  She is exercising regularly and eating healthy but has gained some weight but she admits she was eating a lot of sweets and ice cream over the summer. Discussed ideal weight of 142 lbs to work towards.    CKD 3 -recent recheck on renal function is stable with a creatinine of 1.0 but continue to monitor every 6  months.  Anxiety -okay to fill in December when she calls for refill.  Abnormal glucose-did have an abnormal glucose on her recent fasting labs.  Hemoglobin A1c is 5.8 today which puts her into the prediabetic range.  We will keep an eye on this and plan to recheck in 6 months in the meantime work on eating low sugar low-carb diet.

## 2018-05-08 ENCOUNTER — Encounter: Payer: Self-pay | Admitting: Family Medicine

## 2018-06-23 ENCOUNTER — Other Ambulatory Visit: Payer: Self-pay | Admitting: Family Medicine

## 2018-06-23 NOTE — Telephone Encounter (Signed)
Routing to pcp for signature.Riannah Stagner Lynetta, CMA  

## 2018-08-15 ENCOUNTER — Other Ambulatory Visit: Payer: Self-pay

## 2018-08-15 MED ORDER — LORAZEPAM 1 MG PO TABS: 1.0000 mg | ORAL_TABLET | Freq: Every day | ORAL | 0 refills | Status: DC | PRN

## 2018-08-15 NOTE — Telephone Encounter (Signed)
Patient request Lorazepam.

## 2018-09-04 LAB — HM MAMMOGRAPHY

## 2018-09-11 ENCOUNTER — Encounter: Payer: Self-pay | Admitting: Family Medicine

## 2018-10-12 ENCOUNTER — Other Ambulatory Visit: Payer: Self-pay | Admitting: Family Medicine

## 2018-10-21 ENCOUNTER — Other Ambulatory Visit: Payer: Self-pay

## 2018-10-21 ENCOUNTER — Ambulatory Visit (INDEPENDENT_AMBULATORY_CARE_PROVIDER_SITE_OTHER): Payer: Medicare Other | Admitting: Family Medicine

## 2018-10-21 ENCOUNTER — Encounter: Payer: Self-pay | Admitting: Family Medicine

## 2018-10-21 VITALS — BP 138/70 | HR 60 | Ht 64.0 in | Wt 150.0 lb

## 2018-10-21 DIAGNOSIS — N3000 Acute cystitis without hematuria: Secondary | ICD-10-CM | POA: Diagnosis not present

## 2018-10-21 DIAGNOSIS — R300 Dysuria: Secondary | ICD-10-CM

## 2018-10-21 MED ORDER — NITROFURANTOIN MONOHYD MACRO 100 MG PO CAPS: 100.0000 mg | ORAL_CAPSULE | Freq: Two times a day (BID) | ORAL | 0 refills | Status: DC

## 2018-10-21 NOTE — Progress Notes (Signed)
Pt reports that her sxs began about 6 days ago w/frequency and then a few days later she stated that she began experiencing some burning w/urination. She has not taken any OTC medication for this.  She denies f/s/c/n/v/d. She did c/o having a soreness if she waited to go to the bathroom in the bottom of her stomach, no other pains.Marland KitchenMarland KitchenHeath Gold, CMA

## 2018-10-21 NOTE — Progress Notes (Signed)
Virtual Visit via Video Note  I connected with Kimberly Durham on 10/21/18 at 10:10 AM EDT by a video enabled telemedicine application and verified that I am speaking with the correct person using two identifiers.  I was unable to see the patient on the video screen but they were able to see me.   I discussed the limitations of evaluation and management by telemedicine and the availability of in person appointments. The patient expressed understanding and agreed to proceed.      Subjective:    CC: Urinary frequency  HPI:  Pt reports that her sxs began about 6 days ago w/frequency and then a few days later she stated that she began experiencing some burning w/urination. She has not taken any OTC medication for this. No blood in the urine.  Had antibitics in January for skin surgery.. No back pain.  No nausea.    She denies f/s/c/n/v/d. She did c/o having a soreness if she waited to go to the bathroom in the bottom of her stomach.    Past medical history, Surgical history, Family history not pertinant except as noted below, Social history, Allergies, and medications have been entered into the medical record, reviewed, and corrections made.   Review of Systems: No fevers, chills, night sweats, weight loss, chest pain, or shortness of breath.   Objective:    General: Speaking clearly in complete sentences without any shortness of breath.  Alert and oriented x3.  Normal judgment. No apparent acute distress.    Impression and Recommendations:   UTI -symptoms most consistent with urinary tract infection.  Gave patient reassurance will treat with nitrofurantoin based on allergy list.  She said she took it years ago and did well with it.  I encouraged her to call back if at any point she is developing worsening symptoms, fever, or back pain or nausea or vomiting.  If she is not better after the 5 days of nitrofurantoin I asked her to give [place] a call back and we will order a UA and a culture at that  point in time.     I discussed the assessment and treatment plan with the patient. The patient was provided an opportunity to ask questions and all were answered. The patient agreed with the plan and demonstrated an understanding of the instructions.   The patient was advised to call back or seek an in-person evaluation if the symptoms worsen or if the condition fails to improve as anticipated.   [name], MD

## 2018-11-04 ENCOUNTER — Ambulatory Visit: Payer: Medicare Other | Admitting: Family Medicine

## 2019-01-05 ENCOUNTER — Encounter: Payer: Self-pay | Admitting: Family Medicine

## 2019-01-05 ENCOUNTER — Ambulatory Visit (INDEPENDENT_AMBULATORY_CARE_PROVIDER_SITE_OTHER): Payer: Medicare Other | Admitting: Family Medicine

## 2019-01-05 VITALS — BP 138/64 | HR 67 | Ht 64.0 in | Wt 153.0 lb

## 2019-01-05 DIAGNOSIS — F411 Generalized anxiety disorder: Secondary | ICD-10-CM

## 2019-01-05 DIAGNOSIS — N183 Chronic kidney disease, stage 3 (moderate): Secondary | ICD-10-CM

## 2019-01-05 DIAGNOSIS — R030 Elevated blood-pressure reading, without diagnosis of hypertension: Secondary | ICD-10-CM

## 2019-01-05 DIAGNOSIS — R7301 Impaired fasting glucose: Secondary | ICD-10-CM

## 2019-01-05 DIAGNOSIS — N1832 Chronic kidney disease, stage 3b: Secondary | ICD-10-CM

## 2019-01-05 LAB — POCT GLYCOSYLATED HEMOGLOBIN (HGB A1C): Hemoglobin A1C: 6.1 % — AB (ref 4.0–5.6)

## 2019-01-05 NOTE — Assessment & Plan Note (Signed)
Home blood pressures look fantastic.  We will continue to monitor.  Repeat pressure here looks much better as well.  Due for updated lab work.

## 2019-01-05 NOTE — Assessment & Plan Note (Addendum)
Stable on current regimen.  Happy with it.

## 2019-01-05 NOTE — Assessment & Plan Note (Signed)
Due to recheck renal function. 

## 2019-01-05 NOTE — Assessment & Plan Note (Signed)
Well controlled. Continue current regimen. Follow up in  6 mo  

## 2019-01-05 NOTE — Progress Notes (Signed)
Established Patient Office Visit  Subjective:  Patient ID: Kimberly Durham, female    DOB: 06/07/1936  Age: 83 y.o. MRN: [id number]  CC:  Chief Complaint  Patient presents with  . Follow-up    HPI Kimberly Durham presents for   Impaired fasting glucose-no increased thirst or urination. No symptoms consistent with hypoglycemia.  F/U Anxiety -she is doing well overall.  She says she has had to limit her news intake.  She tries to use her Lorazepam sparingly.  F/U white coat hypertension -doing well.  She brought in her blood pressure log from home the systolic pressures are in the 120s.  The highest was 139 but then she rechecked it later and it came down to 127.  Most of the systolics are in the 89F and 70s.  F/U CKD 3 -no recent changes in renal function.  Due for recheck kidney function.  Past Medical History:  Diagnosis Date  . Anxiety   . Dyslipidemia   . Hemorrhoids   . Migraines   . Osteoporosis   . White coat hypertension     Past Surgical History:  Procedure Laterality Date  . ABDOMINAL HYSTERECTOMY    . FLEXIBLE SIGMOIDOSCOPY  08/2002   negative  . OOPHORECTOMY      Family History  Problem Relation Age of Onset  . Heart disease Mother   . Diabetes Mother   . COPD Father        smoker  . Stroke Brother 74  . Parkinsonism Brother     Social History   Socioeconomic History  . Marital status: Married    Spouse name: Juanda Crumble  . Number of children: 1  . Years of education: Not on file  . Highest education level: Not on file  Occupational History  . Occupation: works partime    Comment: [employer]  . Financial resource strain: Not on file  . Food insecurity    Worry: Not on file    Inability: Not on file  . Transportation needs    Medical: Not on file    Non-medical: Not on file  Tobacco Use  . Smoking status: Never Smoker  . Smokeless tobacco: Never Used  Substance and Sexual Activity  . Alcohol use: Yes  . Drug use: No  . Sexual  activity: Yes    Partners: Male  Lifestyle  . Physical activity    Days per week: Not on file    Minutes per session: Not on file  . Stress: Not on file  Relationships  . Social Herbalist on phone: Not on file    Gets together: Not on file    Attends religious service: Not on file    Active member of club or organization: Not on file    Attends meetings of clubs or organizations: Not on file    Relationship status: Not on file  . Intimate partner violence    Fear of current or ex partner: Not on file    Emotionally abused: Not on file    Physically abused: Not on file    Forced sexual activity: Not on file  Other Topics Concern  . Not on file  Social History Narrative  . Not on file    Outpatient Medications Prior to Visit  Medication Sig Dispense Refill  . calcium carbonate (OS-CAL) 1250 MG tablet Take 1 tablet by mouth 2 (two) times daily.      . fish oil-omega-3 fatty acids 1000 MG capsule  Take 1 g by mouth 3 (three) times daily.      Marland Kitchen. LORazepam (ATIVAN) 1 MG tablet TAKE 1 TABLET BY MOUTH DAILY AS NEEDED FOR ANXIETY 30 tablet 1  . Multiple Vitamin (MULTIVITAMIN) tablet Take 1 tablet by mouth daily.      . Vitamin D, Cholecalciferol, 400 UNITS TABS Take 1 tablet by mouth daily.    . nitrofurantoin, macrocrystal-monohydrate, (MACROBID) 100 MG capsule Take 1 capsule (100 mg total) by mouth 2 (two) times daily. 10 capsule 0   No facility-administered medications prior to visit.     Allergies  Allergen Reactions  . Clindamycin Anaphylaxis and Rash    Pt states gets rash and unable to breath.  . Amoxicillin Other (See Comments)    Jaundice with elevated liver enzymes Jaundice with elevated liver enzymes Other reaction(s): Other (See Comments) Jaundice with elevated liver enzymes  . Augmentin [Amoxicillin-Pot Clavulanate]   . Codeine     REACTION: trouble breathing, palpitations  . Epinephrine     REACTION: trouble breathing, palpitations  . Fosamax  [Alendronate Sodium] Nausea Only  . Prednisone     REACTION: ?  . Sulfasalazine     Other reaction(s): Other (See Comments) REACTION: rash REACTION: rash  . Sulfonamide Derivatives     REACTION: rash    ROS Review of Systems    Objective:    Physical Exam  BP 138/64   Pulse 67   Ht 5\' 4"  (1.626 m)   Wt 153 lb (69.4 kg)   SpO2 99%   BMI 26.26 kg/m  Wt Readings from Last 3 Encounters:  01/05/19 153 lb (69.4 kg)  10/21/18 150 lb (68 kg)  05/07/18 152 lb (68.9 kg)     There are no preventive care reminders to display for this patient.  There are no preventive care reminders to display for this patient.  Lab Results  Component Value Date   TSH 3.85 10/04/2015   Lab Results  Component Value Date   WBC 8.1 04/09/2018   HGB 13.8 04/09/2018   HCT 40.7 04/09/2018   MCV 89.6 04/09/2018   PLT 347 04/09/2018   Lab Results  Component Value Date   NA 138 04/09/2018   K 4.5 04/09/2018   CO2 24 04/09/2018   GLUCOSE 105 (H) 04/09/2018   BUN 21 04/09/2018   CREATININE 1.01 (H) 04/09/2018   BILITOT 1.0 04/09/2018   ALKPHOS 70 05/01/2017   AST 21 04/09/2018   ALT 20 04/09/2018   PROT 7.4 04/09/2018   ALBUMIN 4.2 02/06/2016   CALCIUM 10.2 04/09/2018   ANIONGAP 9 10/02/2015   Lab Results  Component Value Date   CHOL 279 (H) 04/09/2018   Lab Results  Component Value Date   HDL 100 04/09/2018   Lab Results  Component Value Date   LDLCALC 148 (H) 04/09/2018   Lab Results  Component Value Date   TRIG 170 (H) 04/09/2018   Lab Results  Component Value Date   CHOLHDL 2.8 04/09/2018   Lab Results  Component Value Date   HGBA1C 6.1 (A) 01/05/2019      Assessment & Plan:   Problem List Items Addressed This Visit      Cardiovascular and Mediastinum   White coat syndrome with high blood pressure without hypertension    Home blood pressures look fantastic.  We will continue to monitor.  Repeat pressure here looks much better as well.  Due for updated lab  work.      Relevant Orders  COMPLETE METABOLIC PANEL WITH GFR   CBC   Urine Microalbumin w/creat. ratio     Endocrine   IFG (impaired fasting glucose)    Well controlled. Continue current regimen. Follow up in  6 mo      Relevant Orders   COMPLETE METABOLIC PANEL WITH GFR   CBC   Urine Microalbumin w/creat. ratio   POCT glycosylated hemoglobin (Hb A1C) (Completed)     Genitourinary   Chronic kidney disease (CKD) stage G3b/A1, moderately decreased glomerular filtration rate (GFR) between 30-44 mL/min/1.73 square meter and albuminuria creatinine ratio less than 30 mg/g (HCC) - Primary    Due to recheck renal function      Relevant Orders   COMPLETE METABOLIC PANEL WITH GFR   CBC   Urine Microalbumin w/creat. ratio     Other   GAD (generalized anxiety disorder)    Stable on current regimen.  Happy with it.           No orders of the defined types were placed in this encounter.   Follow-up: Return in about 6 months (around 07/07/2019) for glucose and labs. [name].     , MD

## 2019-01-06 LAB — CBC
HCT: 40.7 % (ref 35.0–45.0)
Hemoglobin: 13.9 g/dL (ref 11.7–15.5)
MCH: 31.2 pg (ref 27.0–33.0)
MCHC: 34.2 g/dL (ref 32.0–36.0)
MCV: 91.3 fL (ref 80.0–100.0)
MPV: 10.6 fL (ref 7.5–12.5)
Platelets: 343 10*3/uL (ref 140–400)
RBC: 4.46 10*6/uL (ref 3.80–5.10)
RDW: 12.6 % (ref 11.0–15.0)
WBC: 8.2 10*3/uL (ref 3.8–10.8)

## 2019-01-06 LAB — COMPLETE METABOLIC PANEL WITH GFR
AG Ratio: 1.6 (calc) (ref 1.0–2.5)
ALT: 18 U/L (ref 6–29)
AST: 24 U/L (ref 10–35)
Albumin: 4.4 g/dL (ref 3.6–5.1)
Alkaline phosphatase (APISO): 69 U/L (ref 37–153)
BUN/Creatinine Ratio: 23 (calc) — ABNORMAL HIGH (ref 6–22)
BUN: 25 mg/dL (ref 7–25)
CO2: 26 mmol/L (ref 20–32)
Calcium: 9.9 mg/dL (ref 8.6–10.4)
Chloride: 102 mmol/L (ref 98–110)
Creat: 1.09 mg/dL — ABNORMAL HIGH (ref 0.60–0.88)
GFR, Est African American: 55 mL/min/{1.73_m2} — ABNORMAL LOW (ref >=60)
GFR, Est Non African American: 48 mL/min/{1.73_m2} — ABNORMAL LOW (ref >=60)
Globulin: 2.8 g/dL (calc) (ref 1.9–3.7)
Glucose, Bld: 104 mg/dL — ABNORMAL HIGH (ref 65–99)
Potassium: 4.3 mmol/L (ref 3.5–5.3)
Sodium: 138 mmol/L (ref 135–146)
Total Bilirubin: 0.9 mg/dL (ref 0.2–1.2)
Total Protein: 7.2 g/dL (ref 6.1–8.1)

## 2019-01-06 LAB — MICROALBUMIN / CREATININE URINE RATIO
Creatinine, Urine: 36 mg/dL (ref 20–275)
Microalb Creat Ratio: 50 mcg/mg creat — ABNORMAL HIGH (ref <30)
Microalb, Ur: 1.8 mg/dL

## 2019-02-11 ENCOUNTER — Other Ambulatory Visit: Payer: Self-pay | Admitting: Family Medicine

## 2019-03-26 ENCOUNTER — Telehealth: Payer: Self-pay

## 2019-03-26 NOTE — Telephone Encounter (Signed)
Pt advised.

## 2019-03-26 NOTE — Telephone Encounter (Signed)
Kimberly Durham called to reschedule her appointment for October. She wanted to know if she can change her appointment to October. She was wanting to have her flu vaccine and any other vaccines during this visit.

## 2019-03-26 NOTE — Telephone Encounter (Signed)
That sounds good

## 2019-05-04 ENCOUNTER — Telehealth: Payer: Self-pay

## 2019-05-04 NOTE — Telephone Encounter (Signed)
Opened in error

## 2019-05-07 ENCOUNTER — Ambulatory Visit (INDEPENDENT_AMBULATORY_CARE_PROVIDER_SITE_OTHER): Payer: Medicare Other | Admitting: Family Medicine

## 2019-05-07 ENCOUNTER — Other Ambulatory Visit: Payer: Self-pay

## 2019-05-07 ENCOUNTER — Encounter: Payer: Self-pay | Admitting: Family Medicine

## 2019-05-07 VITALS — BP 130/64 | HR 57 | Ht 64.0 in | Wt 150.0 lb

## 2019-05-07 DIAGNOSIS — R809 Proteinuria, unspecified: Secondary | ICD-10-CM

## 2019-05-07 DIAGNOSIS — R7301 Impaired fasting glucose: Secondary | ICD-10-CM | POA: Diagnosis not present

## 2019-05-07 DIAGNOSIS — N1832 Chronic kidney disease, stage 3b: Secondary | ICD-10-CM

## 2019-05-07 DIAGNOSIS — F411 Generalized anxiety disorder: Secondary | ICD-10-CM

## 2019-05-07 DIAGNOSIS — E785 Hyperlipidemia, unspecified: Secondary | ICD-10-CM

## 2019-05-07 DIAGNOSIS — Z23 Encounter for immunization: Secondary | ICD-10-CM

## 2019-05-07 DIAGNOSIS — R030 Elevated blood-pressure reading, without diagnosis of hypertension: Secondary | ICD-10-CM

## 2019-05-07 LAB — POCT GLYCOSYLATED HEMOGLOBIN (HGB A1C): Hemoglobin A1C: 5.9 % — AB (ref 4.0–5.6)

## 2019-05-07 LAB — POCT UA - MICROALBUMIN
Creatinine, POC: 10 mg/dL
Microalbumin Ur, POC: 10 mg/L

## 2019-05-07 MED ORDER — LISINOPRIL 5 MG PO TABS: 5.0000 mg | ORAL_TABLET | Freq: Every day | ORAL | 3 refills | Status: DC

## 2019-05-07 MED ORDER — LORAZEPAM 1 MG PO TABS: 1.0000 mg | ORAL_TABLET | Freq: Every day | ORAL | 1 refills | Status: DC | PRN

## 2019-05-07 NOTE — Assessment & Plan Note (Signed)
Refilled lorazepam.  Continue to use sparingly.

## 2019-05-07 NOTE — Assessment & Plan Note (Signed)
Home blood pressures overall look fantastic.  Just to elevated pressures one was with a headache.  Continue to monitor periodically at home.

## 2019-05-07 NOTE — Assessment & Plan Note (Signed)
Consistent mild microalbuminuria.  Positive for months ago and positive again today even with a fantastic A1c.  Even though she is not diabetic and is really just prediabetic umbilical ahead and start her on a low-dose ACE inhibitor for renal protection.  She can call if she feels like she is having any side effects from the medication.

## 2019-05-07 NOTE — Progress Notes (Signed)
Established Patient Office Visit  Subjective:  Patient ID: Kimberly Durham, female    DOB: 1935-09-04  Age: 83 y.o. MRN: [id number]  CC:  Chief Complaint  Patient presents with  . ifg    HPI Kimberly Durham presents for   Impaired fasting glucose-no increased thirst or urination. No symptoms consistent with hypoglycemia.  F/u elevated BPs.  She did bring in home blood pressure log.  Most all of her blood pressures look great in the 120s and 130s though she did have 1 day in August where her pressure was high.  She also reports that she had a migraine that day.  She had a second day September 21 was also elevated.  But otherwise they look normal.  F/U CKD 3 -no recent changes.  Due for repeat urine microalbumin today.  She was passing a little bit of protein 4 months ago.  Her A1c looks fantastic so I do not think it is from uncontrolled glucose levels.  Anxiety-she reports that she tries to use her lorazepam sparingly but she would like a refill today.  She says she still has a few left but wants to go ahead and get that filled while she is here.  Past Medical History:  Diagnosis Date  . Anxiety   . Dyslipidemia   . Hemorrhoids   . Migraines   . Osteoporosis   . White coat hypertension     Past Surgical History:  Procedure Laterality Date  . ABDOMINAL HYSTERECTOMY    . FLEXIBLE SIGMOIDOSCOPY  08/2002   negative  . OOPHORECTOMY      Family History  Problem Relation Age of Onset  . Heart disease Mother   . Diabetes Mother   . COPD Father        smoker  . Stroke Brother 74  . Parkinsonism Brother     Social History   Socioeconomic History  . Marital status: Married    Spouse name: [name]  . Number of children: 1  . Years of education: Not on file  . Highest education level: Not on file  Occupational History  . Occupation: works partime    Comment: Industrial/product designer  Social Needs  . Financial resource strain: Not on file  . Food insecurity    Worry: Not on file   Inability: Not on file  . Transportation needs    Medical: Not on file    Non-medical: Not on file  Tobacco Use  . Smoking status: Never Smoker  . Smokeless tobacco: Never Used  Substance and Sexual Activity  . Alcohol use: Yes  . Drug use: No  . Sexual activity: Yes    Partners: Male  Lifestyle  . Physical activity    Days per week: Not on file    Minutes per session: Not on file  . Stress: Not on file  Relationships  . Social Musician on phone: Not on file    Gets together: Not on file    Attends religious service: Not on file    Active member of club or organization: Not on file    Attends meetings of clubs or organizations: Not on file    Relationship status: Not on file  . Intimate partner violence    Fear of current or ex partner: Not on file    Emotionally abused: Not on file    Physically abused: Not on file    Forced sexual activity: Not on file  Other Topics Concern  . Not on  file  Social History Narrative  . Not on file    Outpatient Medications Prior to Visit  Medication Sig Dispense Refill  . calcium carbonate (OS-CAL) 1250 MG tablet Take 1 tablet by mouth 2 (two) times daily.      . fish oil-omega-3 fatty acids 1000 MG capsule Take 1 g by mouth 3 (three) times daily.      . Multiple Vitamin (MULTIVITAMIN) tablet Take 1 tablet by mouth daily.      . Vitamin D, Cholecalciferol, 400 UNITS TABS Take 1 tablet by mouth daily.    Marland Kitchen LORazepam (ATIVAN) 1 MG tablet TAKE 1 TABLET BY MOUTH DAILY AS NEEDED FOR ANXIETY 30 tablet 1   No facility-administered medications prior to visit.     Allergies  Allergen Reactions  . Clindamycin Anaphylaxis and Rash    Pt states gets rash and unable to breath.  . Amoxicillin Other (See Comments)    Jaundice with elevated liver enzymes Jaundice with elevated liver enzymes Other reaction(s): Other (See Comments) Jaundice with elevated liver enzymes  . Augmentin [Amoxicillin-Pot Clavulanate]   . Codeine      REACTION: trouble breathing, palpitations  . Epinephrine     REACTION: trouble breathing, palpitations  . Fosamax [Alendronate Sodium] Nausea Only  . Prednisone     REACTION: ?  . Sulfasalazine     Other reaction(s): Other (See Comments) REACTION: rash REACTION: rash  . Sulfonamide Derivatives     REACTION: rash    ROS Review of Systems    Objective:    Physical Exam  Constitutional: She is oriented to person, place, and time. She appears well-developed and well-nourished.  HENT:  Head: Normocephalic and atraumatic.  Cardiovascular: Normal rate, regular rhythm and normal heart sounds.  No carotid bruits.  Pulmonary/Chest: Effort normal and breath sounds normal.  Neurological: She is alert and oriented to person, place, and time.  Skin: Skin is warm and dry.  Psychiatric: She has a normal mood and affect. Her behavior is normal.    BP 130/64   Pulse (!) 57   Ht 5\' 4"  (1.626 m)   Wt 150 lb (68 kg)   SpO2 98%   BMI 25.75 kg/m  Wt Readings from Last 3 Encounters:  05/07/19 150 lb (68 kg)  01/05/19 153 lb (69.4 kg)  10/21/18 150 lb (68 kg)     Health Maintenance Due  Topic Date Due  . MAMMOGRAM  03/05/2019    There are no preventive care reminders to display for this patient.  Lab Results  Component Value Date   TSH 3.85 10/04/2015   Lab Results  Component Value Date   WBC 8.2 01/05/2019   HGB 13.9 01/05/2019   HCT 40.7 01/05/2019   MCV 91.3 01/05/2019   PLT 343 01/05/2019   Lab Results  Component Value Date   NA 138 01/05/2019   K 4.3 01/05/2019   CO2 26 01/05/2019   GLUCOSE 104 (H) 01/05/2019   BUN 25 01/05/2019   CREATININE 1.09 (H) 01/05/2019   BILITOT 0.9 01/05/2019   ALKPHOS 70 05/01/2017   AST 24 01/05/2019   ALT 18 01/05/2019   PROT 7.2 01/05/2019   ALBUMIN 4.2 02/06/2016   CALCIUM 9.9 01/05/2019   ANIONGAP 9 10/02/2015   Lab Results  Component Value Date   CHOL 279 (H) 04/09/2018   Lab Results  Component Value Date   HDL 100  04/09/2018   Lab Results  Component Value Date   LDLCALC 148 (H) 04/09/2018  Lab Results  Component Value Date   TRIG 170 (H) 04/09/2018   Lab Results  Component Value Date   CHOLHDL 2.8 04/09/2018   Lab Results  Component Value Date   HGBA1C 5.9 (A) 05/07/2019      Assessment & Plan:   Problem List Items Addressed This Visit      Cardiovascular and Mediastinum   White coat syndrome with high blood pressure without hypertension    Home blood pressures overall look fantastic.  Just to elevated pressures one was with a headache.  Continue to monitor periodically at home.      Relevant Medications   lisinopril (ZESTRIL) 5 MG tablet     Endocrine   IFG (impaired fasting glucose)    Lab Results  Component Value Date   HGBA1C 5.9 (A) 05/07/2019   Looks good today.  She is doing a Insurance account managerfantastic job.  Continue to monitor.  Plan to check again in 6 months.      Relevant Orders   POCT glycosylated hemoglobin (Hb A1C) (Completed)   Lipid panel     Genitourinary   Chronic kidney disease (CKD) stage G3b/A1, moderately decreased glomerular filtration rate (GFR) between 30-44 mL/min/1.73 square meter and albuminuria creatinine ratio less than 30 mg/g (HCC) - Primary   Relevant Orders   POCT UA - Microalbumin (Completed)   POCT glycosylated hemoglobin (Hb A1C) (Completed)     Other   Microalbuminuria    Consistent mild microalbuminuria.  Positive for months ago and positive again today even with a fantastic A1c.  Even though she is not diabetic and is really just prediabetic umbilical ahead and start her on a low-dose ACE inhibitor for renal protection.  She can call if she feels like she is having any side effects from the medication.      Relevant Medications   lisinopril (ZESTRIL) 5 MG tablet   GAD (generalized anxiety disorder)    Refilled lorazepam.  Continue to use sparingly.      Relevant Medications   LORazepam (ATIVAN) 1 MG tablet   Dyslipidemia   Relevant  Orders   Lipid panel    Other Visit Diagnoses    Need for immunization against influenza       Relevant Orders   Flu Vaccine QUAD High Dose(Fluad) (Completed)      Meds ordered this encounter  Medications  . lisinopril (ZESTRIL) 5 MG tablet    Sig: Take 1 tablet (5 mg total) by mouth daily.    Dispense:  90 tablet    Refill:  3  . LORazepam (ATIVAN) 1 MG tablet    Sig: Take 1 tablet (1 mg total) by mouth daily as needed. for anxiety    Dispense:  30 tablet    Refill:  1    Follow-up: Return in about 6 months (around 11/05/2019) for Hypertension and check kidneys.    Nani Gasseratherine Lailah Marcelli, MD

## 2019-05-07 NOTE — Assessment & Plan Note (Signed)
Lab Results  Component Value Date   HGBA1C 5.9 (A) 05/07/2019   Looks good today.  She is doing a Chief Technology Officer job.  Continue to monitor.  Plan to check again in 6 months.

## 2019-05-08 LAB — LIPID PANEL
Cholesterol: 280 mg/dL — ABNORMAL HIGH (ref <200)
HDL: 100 mg/dL (ref >=50)
LDL Cholesterol (Calc): 154 mg/dL (calc) — ABNORMAL HIGH
Non-HDL Cholesterol (Calc): 180 mg/dL (calc) — ABNORMAL HIGH (ref <130)
Total CHOL/HDL Ratio: 2.8 (calc) (ref <5.0)
Triglycerides: 132 mg/dL (ref <150)

## 2019-05-11 ENCOUNTER — Encounter: Payer: Self-pay | Admitting: Family Medicine

## 2019-07-07 ENCOUNTER — Ambulatory Visit: Payer: Medicare Other | Admitting: Family Medicine

## 2019-08-03 ENCOUNTER — Other Ambulatory Visit: Payer: Self-pay | Admitting: *Deleted

## 2019-08-03 DIAGNOSIS — F411 Generalized anxiety disorder: Secondary | ICD-10-CM

## 2019-08-03 NOTE — Telephone Encounter (Signed)
Please call patient, and noticed she asked for refill on her Ativan 1 mg.  We really need to continue to work on weaning back on the use of this medication as it does increase risk for developing dementia and increased risk for falls and is not safe to use long-term.  I would really like to decrease her dose down to 0.5 mg tab and then gradually work on decreasing how frequently she is using it.

## 2019-08-13 ENCOUNTER — Ambulatory Visit: Payer: Medicare Other | Attending: Internal Medicine

## 2019-08-13 DIAGNOSIS — Z23 Encounter for immunization: Secondary | ICD-10-CM | POA: Insufficient documentation

## 2019-08-13 NOTE — Telephone Encounter (Signed)
Kathlynn states she didn't request the refill. She states is weaning down.

## 2019-08-13 NOTE — Progress Notes (Signed)
   Covid-19 Vaccination Clinic  Name:  Kimberly Durham    MRN: [id number] DOB: Mar 07, 1936  08/13/2019  [name] was observed post Covid-19 immunization for 15 minutes without incidence. She was provided with Vaccine Information Sheet and instruction to access the V-Safe system.   [name] was instructed to call 911 with any severe reactions post vaccine: Marland Kitchen Difficulty breathing  . Swelling of your face and throat  . A fast heartbeat  . A bad rash all over your body  . Dizziness and weakness    Immunizations Administered    Name Date Dose VIS Date Route   Pfizer COVID-19 Vaccine 08/13/2019 10:52 AM 0.3 mL 07/03/2019 Intramuscular   Manufacturer: [employer], [employer]   Lot: [id number]   NDC: [address]-0

## 2019-08-14 NOTE — Telephone Encounter (Signed)
Kimberly Durham called back again this morning. She wanted to make sure Dr Linford Arnold understands that she did not request the refill of the Ativan. She stated again that she is decreasing the amount she is using daily.

## 2019-08-14 NOTE — Telephone Encounter (Signed)
Okay, perfect.  When she does need a refill then lets have her call us back and let us know.  The pharmacy may have just had her on auto refill which is what I suspect.  Oftentimes we do not know when we get a request from the pharmacy if it is the pharmacy themselves or if it is the patient requesting it.  But please thank her for letting us know.

## 2019-08-19 NOTE — Telephone Encounter (Signed)
LVM w/recommendations for pt about med refill.Laureen Ochs, Viann Shove, CMA

## 2019-08-25 ENCOUNTER — Telehealth: Payer: Self-pay

## 2019-08-25 MED ORDER — LOSARTAN POTASSIUM 25 MG PO TABS: 25.0000 mg | ORAL_TABLET | Freq: Every day | ORAL | 1 refills | Status: DC

## 2019-08-25 NOTE — Telephone Encounter (Signed)
I would recommend that we just go ahead and discontinue it.  We will switch to losartan which is less likely to cause a cough.  Just to note it will say 25 mg but it is equivalent to lisinopril 5 mg.  I just did not want her to see the dose and be worried.

## 2019-08-25 NOTE — Telephone Encounter (Signed)
Patient called stating that since starting her Lisinopril, she has noticed a continuous dry cough and occasional rash with itching. She thinks she may be allergic. Patient states the highest her Bps get at home are 135/68. She is taking Lisinopril 5mg , QD.   Wants to know if she can cut tablet in 1/2 and see if that helps? Please advise

## 2019-08-25 NOTE — Telephone Encounter (Signed)
Patient advised.

## 2019-09-03 ENCOUNTER — Ambulatory Visit: Payer: Medicare Other | Attending: Internal Medicine

## 2019-09-03 DIAGNOSIS — Z23 Encounter for immunization: Secondary | ICD-10-CM | POA: Insufficient documentation

## 2019-09-03 NOTE — Progress Notes (Signed)
   Covid-19 Vaccination Clinic  Name:  Kimberly Durham    MRN: [id number] DOB: April 24, 1936  09/03/2019  Ms. Kimberly Durham was observed post Covid-19 immunization for 15 minutes without incidence. She was provided with Vaccine Information Sheet and instruction to access the V-Safe system.   Ms. Kimberly Durham was instructed to call 911 with any severe reactions post vaccine: Marland Kitchen Difficulty breathing  . Swelling of your face and throat  . A fast heartbeat  . A bad rash all over your body  . Dizziness and weakness    Immunizations Administered    Name Date Dose VIS Date Route   Pfizer COVID-19 Vaccine 09/03/2019 11:28 AM 0.3 mL 07/03/2019 Intramuscular   Manufacturer: [employer], [employer]   Lot: [id number]   NDC: [address]-3

## 2019-09-21 ENCOUNTER — Telehealth: Payer: Self-pay

## 2019-09-21 ENCOUNTER — Other Ambulatory Visit: Payer: Self-pay | Admitting: *Deleted

## 2019-09-21 MED ORDER — LOSARTAN POTASSIUM 25 MG PO TABS: 25.0000 mg | ORAL_TABLET | Freq: Every day | ORAL | 1 refills | Status: DC

## 2019-09-21 NOTE — Telephone Encounter (Signed)
Wonderful.  I look forward to seeing her next month in April.

## 2019-09-21 NOTE — Telephone Encounter (Signed)
Kimberly Durham states the Losartan is working very well.

## 2019-09-22 NOTE — Telephone Encounter (Signed)
Patient advised.

## 2019-09-29 NOTE — Progress Notes (Signed)
Subjective:   Kimberly Durham is a 84 y.o. female who presents for Medicare Annual (Subsequent) preventive examination.  Review of Systems:  No ROS.  Medicare Wellness Virtual Visit.  Visual/audio telehealth visit, UTA vital signs.   See social history for additional risk factors.    Cardiac Risk Factors include: advanced age (>17men, >64 women) Sleep patterns: Getting 6-7 hours of sleep a night. Wakes up 1-2 times a night to void. Wakes up and feels rested and ready for the day.  Home Safety/Smoke Alarms: Feels safe in home. Smoke alarms in place.  Living environment; Lives with husband in a 1 story home and stairs outside the home have hand rails on them.  Shower is a walk in shower and no grab bars in place. Seat Belt Safety/Bike Helmet: Wears seat belt.   Female:   Pap-  Aged out     Mammo-  UTD    Dexa scan-  UTD     CCS- Aged out     Objective:     Vitals: BP (!) 123/54   Temp (!) 97.2 F (36.2 C) (Oral)   Ht 5\' 4"  (1.626 m)   Wt 145 lb (65.8 kg)   SpO2 99%   BMI 24.89 kg/m   Body mass index is 24.89 kg/m.  Advanced Directives 10/05/2019 10/02/2015 11/13/2013  Does Patient Have a Medical Advance Directive? Yes Yes Patient has advance directive, copy not in chart  Type of Advance Directive [place];Living will [place];Living will -  Does patient want to make changes to medical advance directive? No - Patient declined - -  Copy of [place] in Chart? No - copy requested No - copy requested Copy requested from family    Tobacco Social History   Tobacco Use  Smoking Status Never Smoker  Smokeless Tobacco Never Used     Counseling given: No   Clinical Intake:  Pre-visit preparation completed: Yes  Pain : No/denies pain     Nutritional Risks: None Diabetes: No  How often do you need to have someone help you when you read instructions, pamphlets, or other written materials from your doctor or  pharmacy?: 1 - Never What is the last grade level you completed in school?: 12  Interpreter Needed?: No  Information entered by :: Orlie Dakin, LPN  Past Medical History:  Diagnosis Date  . Anxiety   . Dyslipidemia   . Hemorrhoids   . Migraines   . Osteoporosis   . White coat hypertension    Past Surgical History:  Procedure Laterality Date  . ABDOMINAL HYSTERECTOMY    . FLEXIBLE SIGMOIDOSCOPY  08/2002   negative  . OOPHORECTOMY     Family History  Problem Relation Age of Onset  . Heart disease Mother   . Diabetes Mother   . COPD Father        smoker  . Stroke Brother 9  . Parkinsonism Brother    Social History   Socioeconomic History  . Marital status: Married    Spouse name: Juanda Crumble  . Number of children: 1  . Years of education: 67  . Highest education level: High school graduate  Occupational History  . Occupation: works partime    Comment: [employer]  Tobacco Use  . Smoking status: Never Smoker  . Smokeless tobacco: Never Used  Substance and Sexual Activity  . Alcohol use: Yes    Alcohol/week: 1.0 standard drinks    Types: 1 Glasses of wine per  week    Comment: occasionally  . Drug use: No  . Sexual activity: Yes    Partners: Male  Other Topics Concern  . Not on file  Social History Narrative   Chores around the house   Exercise   2 cups of coffee daily   Social Determinants of Health   Financial Resource Strain:   . Difficulty of Paying Living Expenses:   Food Insecurity:   . Worried About Programme researcher, broadcasting/film/video in the Last Year:   . Barista in the Last Year:   Transportation Needs:   . Freight forwarder (Medical):   Marland Kitchen Lack of Transportation (Non-Medical):   Physical Activity:   . Days of Exercise per Week:   . Minutes of Exercise per Session:   Stress:   . Feeling of Stress :   Social Connections:   . Frequency of Communication with Friends and Family:   . Frequency of Social Gatherings with Friends and Family:   . Attends  Religious Services:   . Active Member of Clubs or Organizations:   . Attends Banker Meetings:   Marland Kitchen Marital Status:     Outpatient Encounter Medications as of 10/05/2019  Medication Sig  . calcium carbonate (OS-CAL) 1250 MG tablet Take 1 tablet by mouth 2 (two) times daily.    . fish oil-omega-3 fatty acids 1000 MG capsule Take 1 g by mouth 3 (three) times daily.    Marland Kitchen LORazepam (ATIVAN) 1 MG tablet Take 1 tablet (1 mg total) by mouth daily as needed. for anxiety  . losartan (COZAAR) 25 MG tablet Take 1 tablet (25 mg total) by mouth daily.  . Multiple Vitamin (MULTIVITAMIN) tablet Take 1 tablet by mouth daily.    . Vitamin D, Cholecalciferol, 400 UNITS TABS Take 1 tablet by mouth daily.   No facility-administered encounter medications on file as of 10/05/2019.    Activities of Daily Living In your present state of health, do you have any difficulty performing the following activities: 10/05/2019  Hearing? N  Vision? N  Difficulty concentrating or making decisions? N  Walking or climbing stairs? N  Dressing or bathing? N  Doing errands, shopping? N  Preparing Food and eating ? N  Using the Toilet? N  In the past six months, have you accidently leaked urine? Y  Comment occasionally  Do you have problems with loss of bowel control? N  Managing your Medications? N  Managing your Finances? N  Housekeeping or managing your Housekeeping? N  Some recent data might be hidden    Patient Care Team: Agapito Games, MD as PCP - General (Family Medicine) Hvozdovic, Tollie Pizza, PA-C (Inactive) as Physician Assistant (Gastroenterology) Lucinda Dell, MD (Dermatology) Truitt Leep, MD as Attending Physician (Dermatology) Glade Stanford, MD    Assessment:   This is a routine wellness examination for Karmina.Physical assessment deferred to PCP.   Exercise Activities and Dietary recommendations Current Exercise Habits: Home exercise routine, Type of exercise:  stretching;walking, Time (Minutes): 45, Frequency (Times/Week): 4, Weekly Exercise (Minutes/Week): 180, Intensity: Mild, Exercise limited by: None identified Diet  Eats a healthy diet of fruits, vegetables and proteins. Breakfast: Oatmeal with prunes Lunch: skips Dinner: Meat and vegetables, does a lot of fish and chicken Drinks 2-3 bottles of water daily 16 ounces.      Goals    . Patient Stated     Continue to stay healthy and active.       Fall Risk  Fall Risk  10/05/2019 05/07/2019 05/07/2018 11/05/2017 05/07/2017  Falls in the past year? 0 0 No No No  Number falls in past yr: - 0 - - -  Injury with Fall? - 0 - - -  Risk for fall due to : No Fall Risks - - - -  Follow up Falls prevention discussed - - - -   Is the patient's home free of loose throw rugs in walkways, pet beds, electrical cords, etc?   yes      Grab bars in the bathroom? no      Handrails on the stairs?   yes      Adequate lighting?   yes   Depression Screen PHQ 2/9 Scores 10/05/2019 10/21/2018 05/07/2018 11/05/2017  PHQ - 2 Score 0 0 0 0     Cognitive Function     6CIT Screen 10/05/2019 07/03/2016  What Year? 0 points 0 points  What month? 0 points 0 points  What time? 0 points 0 points  Count back from 20 0 points 0 points  Months in reverse 0 points 0 points  Repeat phrase 0 points 0 points  Total Score 0 0    Immunization History  Administered Date(s) Administered  . Fluad Quad(high Dose 65+) 05/07/2019  . Influenza Split 05/15/2012  . Influenza Whole 05/26/2007, 04/20/2008, 04/18/2009, 04/25/2010  . Influenza, High Dose Seasonal PF 06/01/2016, 05/07/2017, 05/07/2018  . Influenza,inj,Quad PF,6+ Mos 05/15/2013, 05/17/2014, 05/18/2015  . PFIZER SARS-COV-2 Vaccination 08/13/2019, 09/03/2019  . Pneumococcal Conjugate-13 06/23/2014  . Pneumococcal Polysaccharide-23 03/04/2003  . Td 02/20/2002  . Tdap 05/15/2012  . Zoster 04/25/2010    Screening Tests Health Maintenance  Topic Date Due  .  TETANUS/TDAP  05/15/2022  . INFLUENZA VACCINE  Completed  . DEXA SCAN  Completed  . PNA vac Low Risk Adult  Completed      Plan:    Please schedule your next medicare wellness visit with me in 1 yr.  Ms. Brede , Thank you for taking time to come for your Medicare Wellness Visit. I appreciate your ongoing commitment to your health goals. Please review the following plan we discussed and let me know if I can assist you in the future.  Continue doing brain stimulating activities (puzzles, reading, adult coloring books, staying active) to keep memory sharp.  Bring a copy of your living will and/or healthcare power of attorney to your next office visit.   These are the goals we discussed: Goals    . Patient Stated     Continue to stay healthy and active.       This is a list of the screening recommended for you and due dates:  Health Maintenance  Topic Date Due  . Tetanus Vaccine  05/15/2022  . Flu Shot  Completed  . DEXA scan (bone density measurement)  Completed  . Pneumonia vaccines  Completed      I have personally reviewed and noted the following in the patient's chart:   . Medical and social history . Use of alcohol, tobacco or illicit drugs  . Current medications and supplements . Functional ability and status . Nutritional status . Physical activity . Advanced directives . List of other physicians . Hospitalizations, surgeries, and ER visits in previous 12 months . Vitals . Screenings to include cognitive, depression, and falls . Referrals and appointments  In addition, I have reviewed and discussed with patient certain preventive protocols, quality metrics, and best practice recommendations. A written personalized care plan for preventive  services as well as general preventive health recommendations were provided to patient.     Normand Sloop, LPN  0/32/1224

## 2019-10-05 ENCOUNTER — Ambulatory Visit (INDEPENDENT_AMBULATORY_CARE_PROVIDER_SITE_OTHER): Payer: Medicare Other | Admitting: *Deleted

## 2019-10-05 VITALS — BP 123/54 | Temp 97.2°F | Ht 64.0 in | Wt 145.0 lb

## 2019-10-05 DIAGNOSIS — Z0000 Encounter for general adult medical examination without abnormal findings: Secondary | ICD-10-CM

## 2019-10-05 NOTE — Patient Instructions (Addendum)
Please schedule your next medicare wellness visit with me in 1 yr.  Ms. Kimberly Durham , Thank you for taking time to come for your Medicare Wellness Visit. I appreciate your ongoing commitment to your health goals. Please review the following plan we discussed and let me know if I can assist you in the future.  Continue doing brain stimulating activities (puzzles, reading, adult coloring books, staying active) to keep memory sharp.  Bring a copy of your living will and/or healthcare power of attorney to your next office visit. These are the goals we discussed: Goals    . Patient Stated     Continue to stay healthy and active.      Continue to stay active, you are doing a great job!!

## 2019-10-08 ENCOUNTER — Other Ambulatory Visit: Payer: Self-pay

## 2019-10-08 DIAGNOSIS — F411 Generalized anxiety disorder: Secondary | ICD-10-CM

## 2019-10-09 MED ORDER — LORAZEPAM 1 MG PO TABS: 1.0000 mg | ORAL_TABLET | Freq: Every day | ORAL | 1 refills | Status: DC | PRN

## 2019-10-26 LAB — HM MAMMOGRAPHY

## 2019-11-05 ENCOUNTER — Ambulatory Visit (INDEPENDENT_AMBULATORY_CARE_PROVIDER_SITE_OTHER): Payer: Medicare Other | Admitting: Family Medicine

## 2019-11-05 ENCOUNTER — Other Ambulatory Visit: Payer: Self-pay

## 2019-11-05 ENCOUNTER — Encounter: Payer: Self-pay | Admitting: Family Medicine

## 2019-11-05 VITALS — BP 128/70 | HR 74 | Ht 64.0 in | Wt 150.0 lb

## 2019-11-05 DIAGNOSIS — R030 Elevated blood-pressure reading, without diagnosis of hypertension: Secondary | ICD-10-CM | POA: Diagnosis not present

## 2019-11-05 DIAGNOSIS — N1832 Chronic kidney disease, stage 3b: Secondary | ICD-10-CM

## 2019-11-05 DIAGNOSIS — G43109 Migraine with aura, not intractable, without status migrainosus: Secondary | ICD-10-CM

## 2019-11-05 DIAGNOSIS — R7301 Impaired fasting glucose: Secondary | ICD-10-CM | POA: Diagnosis not present

## 2019-11-05 LAB — POCT GLYCOSYLATED HEMOGLOBIN (HGB A1C): Hemoglobin A1C: 5.9 % — AB (ref 4.0–5.6)

## 2019-11-05 LAB — BASIC METABOLIC PANEL WITH GFR
BUN/Creatinine Ratio: 24 (calc) — ABNORMAL HIGH (ref 6–22)
BUN: 24 mg/dL (ref 7–25)
CO2: 27 mmol/L (ref 20–32)
Calcium: 9.8 mg/dL (ref 8.6–10.4)
Chloride: 102 mmol/L (ref 98–110)
Creat: 0.99 mg/dL — ABNORMAL HIGH (ref 0.60–0.88)
GFR, Est African American: 62 mL/min/{1.73_m2} (ref >=60)
GFR, Est Non African American: 53 mL/min/{1.73_m2} — ABNORMAL LOW (ref >=60)
Glucose, Bld: 101 mg/dL (ref 65–139)
Potassium: 4.6 mmol/L (ref 3.5–5.3)
Sodium: 137 mmol/L (ref 135–146)

## 2019-11-05 NOTE — Progress Notes (Signed)
All labs are normal. 

## 2019-11-05 NOTE — Assessment & Plan Note (Signed)
To recheck renal function. 

## 2019-11-05 NOTE — Assessment & Plan Note (Addendum)
All blood pressures look fantastic and actually her blood pressure here today in the office looks fantastic.  10 you losartan 25 mg daily.

## 2019-11-05 NOTE — Assessment & Plan Note (Signed)
Uses lorazepam half a tab as needed for headaches.  Discussed that on her next refill we will try decreasing her dose down to 0.5 mg per discussed the potential risks of continuing benzodiazepine use.

## 2019-11-05 NOTE — Progress Notes (Signed)
Established Patient Office Visit  Subjective:  Patient ID: Kimberly Durham, female    DOB: 03/22/1936  Age: 84 y.o. MRN: [id number]  CC:  Chief Complaint  Patient presents with  . Hypertension  . ifg    HPI Kimberly Durham presents for   Impaired fasting glucose-no increased thirst or urination. No symptoms consistent with hypoglycemia.  F/U CKD 3 - no recent changes.    F/U white coat HTN -she brought in her home blood pressure log.  Most of them look absolutely fantastic she does have a few blood pressures in the upper 130s.  F/U Anxiety -currently uses lorazepam as needed.  60 tabs usually lasts her 6 months.  She says she always splits the tab and never takes more than a half a tab during the day.  She mostly uses it for headaches.  She has tried Tylenol which does not seem to help his headache she was given the medication years ago and feels like it is very effective.  Past Medical History:  Diagnosis Date  . Anxiety   . Dyslipidemia   . Hemorrhoids   . Migraines   . Osteoporosis   . White coat hypertension     Past Surgical History:  Procedure Laterality Date  . ABDOMINAL HYSTERECTOMY    . FLEXIBLE SIGMOIDOSCOPY  08/2002   negative  . OOPHORECTOMY      Family History  Problem Relation Age of Onset  . Heart disease Mother   . Diabetes Mother   . COPD Father        smoker  . Stroke Brother 94  . Parkinsonism Brother     Social History   Socioeconomic History  . Marital status: Married    Spouse name: [name]  . Number of children: 1  . Years of education: 65  . Highest education level: High school graduate  Occupational History  . Occupation: works partime    Comment: [employer]  Tobacco Use  . Smoking status: Never Smoker  . Smokeless tobacco: Never Used  Substance and Sexual Activity  . Alcohol use: Yes    Alcohol/week: 1.0 standard drinks    Types: 1 Glasses of wine per week    Comment: occasionally  . Drug use: No  . Sexual activity: Yes     Partners: Male  Other Topics Concern  . Not on file  Social History Narrative   Chores around the house   Exercise   2 cups of coffee daily   Social Determinants of Health   Financial Resource Strain:   . Difficulty of Paying Living Expenses:   Food Insecurity:   . Worried About Programme researcher, broadcasting/film/video in the Last Year:   . Barista in the Last Year:   Transportation Needs:   . Freight forwarder (Medical):   Marland Kitchen Lack of Transportation (Non-Medical):   Physical Activity:   . Days of Exercise per Week:   . Minutes of Exercise per Session:   Stress:   . Feeling of Stress :   Social Connections:   . Frequency of Communication with Friends and Family:   . Frequency of Social Gatherings with Friends and Family:   . Attends Religious Services:   . Active Member of Clubs or Organizations:   . Attends Banker Meetings:   Marland Kitchen Marital Status:   Intimate Partner Violence:   . Fear of Current or Ex-Partner:   . Emotionally Abused:   Marland Kitchen Physically Abused:   . Sexually Abused:  Outpatient Medications Prior to Visit  Medication Sig Dispense Refill  . calcium carbonate (OS-CAL) 1250 MG tablet Take 1 tablet by mouth 2 (two) times daily.      . fish oil-omega-3 fatty acids 1000 MG capsule Take 1 g by mouth 3 (three) times daily.      Marland Kitchen LORazepam (ATIVAN) 1 MG tablet Take 1 tablet (1 mg total) by mouth daily as needed. for anxiety 30 tablet 1  . losartan (COZAAR) 25 MG tablet Take 1 tablet (25 mg total) by mouth daily. 90 tablet 1  . Multiple Vitamin (MULTIVITAMIN) tablet Take 1 tablet by mouth daily.      . Vitamin D, Cholecalciferol, 400 UNITS TABS Take 1 tablet by mouth daily.     No facility-administered medications prior to visit.    Allergies  Allergen Reactions  . Clindamycin Anaphylaxis and Rash    Pt states gets rash and unable to breath.  . Amoxicillin Other (See Comments)    Jaundice with elevated liver enzymes Jaundice with elevated liver  enzymes Other reaction(s): Other (See Comments) Jaundice with elevated liver enzymes  . Augmentin [Amoxicillin-Pot Clavulanate]   . Codeine     REACTION: trouble breathing, palpitations  . Epinephrine     REACTION: trouble breathing, palpitations  . Fosamax [Alendronate Sodium] Nausea Only  . Lisinopril Other (See Comments)    Cough  . Prednisone     REACTION: ?  . Sulfasalazine     Other reaction(s): Other (See Comments) REACTION: rash REACTION: rash  . Sulfonamide Derivatives     REACTION: rash    ROS Review of Systems    Objective:    Physical Exam  Constitutional: She is oriented to person, place, and time. She appears well-developed and well-nourished.  HENT:  Head: Normocephalic and atraumatic.  Cardiovascular: Normal rate, regular rhythm and normal heart sounds.  Pulmonary/Chest: Effort normal and breath sounds normal.  Neurological: She is alert and oriented to person, place, and time.  Skin: Skin is warm and dry.  Psychiatric: She has a normal mood and affect. Her behavior is normal.    BP 128/70   Pulse 74   Ht 5\' 4"  (1.626 m)   Wt 150 lb (68 kg)   SpO2 98%   BMI 25.75 kg/m  Wt Readings from Last 3 Encounters:  11/05/19 150 lb (68 kg)  10/05/19 145 lb (65.8 kg)  05/07/19 150 lb (68 kg)     There are no preventive care reminders to display for this patient.  There are no preventive care reminders to display for this patient.  Lab Results  Component Value Date   TSH 3.85 10/04/2015   Lab Results  Component Value Date   WBC 8.2 01/05/2019   HGB 13.9 01/05/2019   HCT 40.7 01/05/2019   MCV 91.3 01/05/2019   PLT 343 01/05/2019   Lab Results  Component Value Date   NA 138 01/05/2019   K 4.3 01/05/2019   CO2 26 01/05/2019   GLUCOSE 104 (H) 01/05/2019   BUN 25 01/05/2019   CREATININE 1.09 (H) 01/05/2019   BILITOT 0.9 01/05/2019   ALKPHOS 70 05/01/2017   AST 24 01/05/2019   ALT 18 01/05/2019   PROT 7.2 01/05/2019   ALBUMIN 4.2  02/06/2016   CALCIUM 9.9 01/05/2019   ANIONGAP 9 10/02/2015   Lab Results  Component Value Date   CHOL 280 (H) 05/07/2019   Lab Results  Component Value Date   HDL 100 05/07/2019   Lab Results  Component  Value Date   LDLCALC 154 (H) 05/07/2019   Lab Results  Component Value Date   TRIG 132 05/07/2019   Lab Results  Component Value Date   CHOLHDL 2.8 05/07/2019   Lab Results  Component Value Date   HGBA1C 5.9 (A) 11/05/2019      Assessment & Plan:   Problem List Items Addressed This Visit      Cardiovascular and Mediastinum   White coat syndrome with high blood pressure without hypertension - Primary    All blood pressures look fantastic and actually her blood pressure here today in the office looks fantastic.  10 you losartan 25 mg daily.      Migraine with aura    Uses lorazepam half a tab as needed for headaches.  Discussed that on her next refill we will try decreasing her dose down to 0.5 mg per discussed the potential risks of continuing benzodiazepine use.        Endocrine   IFG (impaired fasting glucose)   Relevant Orders   POCT glycosylated hemoglobin (Hb A1C) (Completed)     Genitourinary   Chronic kidney disease (CKD) stage G3b/A1, moderately decreased glomerular filtration rate (GFR) between 30-44 mL/min/1.73 square meter and albuminuria creatinine ratio less than 30 mg/g (HCC)    To recheck renal function.      Relevant Orders   BASIC METABOLIC PANEL WITH GFR      No orders of the defined types were placed in this encounter.   Follow-up: Return in about 6 months (around 05/06/2020) for recheck kidneys .    Beatrice Lecher, MD

## 2019-11-09 ENCOUNTER — Encounter: Payer: Self-pay | Admitting: Family Medicine

## 2020-01-19 ENCOUNTER — Encounter: Payer: Self-pay | Admitting: Family Medicine

## 2020-02-23 ENCOUNTER — Other Ambulatory Visit: Payer: Self-pay

## 2020-02-23 DIAGNOSIS — F411 Generalized anxiety disorder: Secondary | ICD-10-CM

## 2020-02-23 MED ORDER — LORAZEPAM 1 MG PO TABS: 1.0000 mg | ORAL_TABLET | Freq: Every day | ORAL | 1 refills | Status: DC | PRN

## 2020-03-12 ENCOUNTER — Other Ambulatory Visit: Payer: Self-pay | Admitting: Family Medicine

## 2020-05-04 ENCOUNTER — Encounter: Payer: Self-pay | Admitting: Family Medicine

## 2020-05-04 ENCOUNTER — Ambulatory Visit (INDEPENDENT_AMBULATORY_CARE_PROVIDER_SITE_OTHER): Payer: Medicare Other | Admitting: Family Medicine

## 2020-05-04 VITALS — BP 162/74 | HR 78 | Ht 64.0 in | Wt 152.0 lb

## 2020-05-04 DIAGNOSIS — N1832 Chronic kidney disease, stage 3b: Secondary | ICD-10-CM | POA: Diagnosis not present

## 2020-05-04 DIAGNOSIS — E785 Hyperlipidemia, unspecified: Secondary | ICD-10-CM | POA: Diagnosis not present

## 2020-05-04 DIAGNOSIS — M810 Age-related osteoporosis without current pathological fracture: Secondary | ICD-10-CM

## 2020-05-04 DIAGNOSIS — R7301 Impaired fasting glucose: Secondary | ICD-10-CM

## 2020-05-04 DIAGNOSIS — R030 Elevated blood-pressure reading, without diagnosis of hypertension: Secondary | ICD-10-CM

## 2020-05-04 DIAGNOSIS — Z23 Encounter for immunization: Secondary | ICD-10-CM

## 2020-05-04 DIAGNOSIS — F411 Generalized anxiety disorder: Secondary | ICD-10-CM

## 2020-05-04 LAB — POCT GLYCOSYLATED HEMOGLOBIN (HGB A1C): Hemoglobin A1C: 5.9 % — AB (ref 4.0–5.6)

## 2020-05-04 NOTE — Assessment & Plan Note (Signed)
Has been working really diligently to cut back on her lorazepam she says most the time if she uses it she will take either a quarter or half of a tab.  She said she is really quite proud of herself for making her prescription last a little over 2 months.  She says she still has 1 refill left so does not need a new prescription today but will likely need 1 in January

## 2020-05-04 NOTE — Progress Notes (Signed)
Established Patient Office Visit  Subjective:  Patient ID: Kimberly Durham, female    DOB: 23-Mar-1936  Age: 84 y.o. MRN: [id number]  CC:  Chief Complaint  Patient presents with   Chronic Kidney Disease   ifg    HPI Elowen Debruyn presents for   Impaired fasting glucose-no increased thirst or urination. No symptoms consistent with hypoglycemia.  F/U CKD 3 -no recent changes in renal function.  In home blood pressure log as she does have whitecoat hypertension.  Most of her blood pressures are in the 120s in the low 130s.  The highest was 140/74.  Pulse usually remains in the 60s and 50s.  Past Medical History:  Diagnosis Date   Anxiety    Dyslipidemia    Hemorrhoids    Migraines    Osteoporosis    White coat hypertension     Past Surgical History:  Procedure Laterality Date   ABDOMINAL HYSTERECTOMY     FLEXIBLE SIGMOIDOSCOPY  08/2002   negative   OOPHORECTOMY      Family History  Problem Relation Age of Onset   Heart disease Mother    Diabetes Mother    COPD Father        smoker   Stroke Brother 54   Parkinsonism Brother     Social History   Socioeconomic History   Marital status: Married    Spouse name: [name]   Number of children: 1   Years of education: 12   Highest education level: High school graduate  Occupational History   Occupation: works partime    Comment: [employer]  Tobacco Use   Smoking status: Never Smoker   Smokeless tobacco: Never Used  Building services engineer Use: Never used  Substance and Sexual Activity   Alcohol use: Yes    Alcohol/week: 1.0 standard drink    Types: 1 Glasses of wine per week    Comment: occasionally   Drug use: No   Sexual activity: Yes    Partners: Male  Other Topics Concern   Not on file  Social History Narrative   Chores around the house   Exercise   2 cups of coffee daily   Social Determinants of Health   Financial Resource Strain:    Difficulty of Paying Living Expenses: Not on  file  Food Insecurity:    Worried About Running Out of Food in the Last Year: Not on file   [employer] of Food in the Last Year: Not on file  Transportation Needs:    Lack of Transportation (Medical): Not on file   Lack of Transportation (Non-Medical): Not on file  Physical Activity:    Days of Exercise per Week: Not on file   Minutes of Exercise per Session: Not on file  Stress:    Feeling of Stress : Not on file  Social Connections:    Frequency of Communication with Friends and Family: Not on file   Frequency of Social Gatherings with Friends and Family: Not on file   Attends Religious Services: Not on file   Active Member of Clubs or Organizations: Not on file   Attends Banker Meetings: Not on file   Marital Status: Not on file  Intimate Partner Violence:    Fear of Current or Ex-Partner: Not on file   Emotionally Abused: Not on file   Physically Abused: Not on file   Sexually Abused: Not on file    Outpatient Medications Prior to Visit  Medication Sig Dispense Refill  calcium carbonate (OS-CAL) 1250 MG tablet Take 1 tablet by mouth 2 (two) times daily.       fish oil-omega-3 fatty acids 1000 MG capsule Take 1 g by mouth 3 (three) times daily.       LORazepam (ATIVAN) 1 MG tablet Take 1 tablet (1 mg total) by mouth daily as needed. for anxiety 30 tablet 1   losartan (COZAAR) 25 MG tablet Take 1 tablet (25 mg total) by mouth daily. Needs appt 90 tablet 0   Multiple Vitamin (MULTIVITAMIN) tablet Take 1 tablet by mouth daily.       Vitamin D, Cholecalciferol, 400 UNITS TABS Take 1 tablet by mouth daily.     No facility-administered medications prior to visit.    Allergies  Allergen Reactions   Clindamycin Anaphylaxis and Rash    Pt states gets rash and unable to breath.   Amoxicillin Other (See Comments)    Jaundice with elevated liver enzymes Jaundice with elevated liver enzymes Other reaction(s): Other (See Comments) Jaundice with  elevated liver enzymes   Augmentin [Amoxicillin-Pot Clavulanate]    Codeine     REACTION: trouble breathing, palpitations   Epinephrine     REACTION: trouble breathing, palpitations   Fosamax [Alendronate Sodium] Nausea Only   Lisinopril Other (See Comments)    Cough   Prednisone     REACTION: ?   Sulfasalazine     Other reaction(s): Other (See Comments) REACTION: rash REACTION: rash   Sulfonamide Derivatives     REACTION: rash    ROS Review of Systems    Objective:    Physical Exam Constitutional:      Appearance: She is well-developed.  HENT:     Head: Normocephalic and atraumatic.  Cardiovascular:     Rate and Rhythm: Normal rate and regular rhythm.     Heart sounds: Normal heart sounds.  Pulmonary:     Effort: Pulmonary effort is normal.     Breath sounds: Normal breath sounds.  Musculoskeletal:     Cervical back: Neck supple. No rigidity or tenderness.  Skin:    General: Skin is warm and dry.  Neurological:     Mental Status: She is alert and oriented to person, place, and time.  Psychiatric:        Behavior: Behavior normal.     BP (!) 162/74    Pulse 78    Ht 5\' 4"  (1.626 m)    Wt 152 lb (68.9 kg)    SpO2 96%    BMI 26.09 kg/m  Wt Readings from Last 3 Encounters:  05/04/20 152 lb (68.9 kg)  11/05/19 150 lb (68 kg)  10/05/19 145 lb (65.8 kg)     There are no preventive care reminders to display for this patient.  There are no preventive care reminders to display for this patient.  Lab Results  Component Value Date   TSH 3.85 10/04/2015   Lab Results  Component Value Date   WBC 8.2 01/05/2019   HGB 13.9 01/05/2019   HCT 40.7 01/05/2019   MCV 91.3 01/05/2019   PLT 343 01/05/2019   Lab Results  Component Value Date   NA 137 11/05/2019   K 4.6 11/05/2019   CO2 27 11/05/2019   GLUCOSE 101 11/05/2019   BUN 24 11/05/2019   CREATININE 0.99 (H) 11/05/2019   BILITOT 0.9 01/05/2019   ALKPHOS 70 05/01/2017   AST 24 01/05/2019   ALT  18 01/05/2019   PROT 7.2 01/05/2019   ALBUMIN 4.2 02/06/2016  CALCIUM 9.8 11/05/2019   ANIONGAP 9 10/02/2015   Lab Results  Component Value Date   CHOL 280 (H) 05/07/2019   Lab Results  Component Value Date   HDL 100 05/07/2019   Lab Results  Component Value Date   LDLCALC 154 (H) 05/07/2019   Lab Results  Component Value Date   TRIG 132 05/07/2019   Lab Results  Component Value Date   CHOLHDL 2.8 05/07/2019   Lab Results  Component Value Date   HGBA1C 5.9 (A) 05/04/2020      Assessment & Plan:   Problem List Items Addressed This Visit      Cardiovascular and Mediastinum   White coat syndrome with high blood pressure without hypertension     Endocrine   IFG (impaired fasting glucose) - Primary    Hemoglobin A1c looks great today at 5.9.  Rock steady.  Plan to recheck again in 6 months.  Continue to work on healthy diet and regular exercise.      Relevant Orders   POCT glycosylated hemoglobin (Hb A1C) (Completed)   Lipid Panel w/reflex Direct LDL   COMPLETE METABOLIC PANEL WITH GFR   Microalbumin, urine   CBC     Musculoskeletal and Integument   Osteoporosis   Relevant Orders   DG Bone Density     Genitourinary   Chronic kidney disease (CKD) stage G3b/A1, moderately decreased glomerular filtration rate (GFR) between 30-44 mL/min/1.73 square meter and albuminuria creatinine ratio less than 30 mg/g (HCC)    Her renal function every 6 months.  She is on an ARB.      Relevant Orders   POCT glycosylated hemoglobin (Hb A1C) (Completed)   Lipid Panel w/reflex Direct LDL   COMPLETE METABOLIC PANEL WITH GFR   Microalbumin, urine   CBC     Other   GAD (generalized anxiety disorder)    Has been working really diligently to cut back on her lorazepam she says most the time if she uses it she will take either a quarter or half of a tab.  She said she is really quite proud of herself for making her prescription last a little over 2 months.  She says she still  has 1 refill left so does not need a new prescription today but will likely need 1 in January      Dyslipidemia    Due to recheck lipids.      Relevant Orders   POCT glycosylated hemoglobin (Hb A1C) (Completed)   Lipid Panel w/reflex Direct LDL   COMPLETE METABOLIC PANEL WITH GFR   Microalbumin, urine   CBC    Other Visit Diagnoses    Need for immunization against influenza       Relevant Orders   Flu Vaccine QUAD High Dose(Fluad) (Completed)      No orders of the defined types were placed in this encounter.   Follow-up: Return in about 6 months (around 11/02/2020) for A1C and meds. [name], MD

## 2020-05-04 NOTE — Assessment & Plan Note (Signed)
Hemoglobin A1c looks great today at 5.9.  Rock steady.  Plan to recheck again in 6 months.  Continue to work on healthy diet and regular exercise.

## 2020-05-04 NOTE — Assessment & Plan Note (Signed)
Due to recheck lipids. 

## 2020-05-04 NOTE — Assessment & Plan Note (Signed)
Her renal function every 6 months.  She is on an ARB.

## 2020-05-05 LAB — MICROALBUMIN, URINE: Microalb, Ur: 2.2 mg/dL

## 2020-05-05 LAB — COMPLETE METABOLIC PANEL WITH GFR
AG Ratio: 1.5 (calc) (ref 1.0–2.5)
ALT: 16 U/L (ref 6–29)
AST: 19 U/L (ref 10–35)
Albumin: 4.5 g/dL (ref 3.6–5.1)
Alkaline phosphatase (APISO): 69 U/L (ref 37–153)
BUN/Creatinine Ratio: 26 (calc) — ABNORMAL HIGH (ref 6–22)
BUN: 25 mg/dL (ref 7–25)
CO2: 26 mmol/L (ref 20–32)
Calcium: 10.1 mg/dL (ref 8.6–10.4)
Chloride: 102 mmol/L (ref 98–110)
Creat: 0.98 mg/dL — ABNORMAL HIGH (ref 0.60–0.88)
GFR, Est African American: 62 mL/min/{1.73_m2} (ref >=60)
GFR, Est Non African American: 54 mL/min/{1.73_m2} — ABNORMAL LOW (ref >=60)
Globulin: 3 g/dL (calc) (ref 1.9–3.7)
Glucose, Bld: 108 mg/dL — ABNORMAL HIGH (ref 65–99)
Potassium: 5.5 mmol/L — ABNORMAL HIGH (ref 3.5–5.3)
Sodium: 138 mmol/L (ref 135–146)
Total Bilirubin: 0.8 mg/dL (ref 0.2–1.2)
Total Protein: 7.5 g/dL (ref 6.1–8.1)

## 2020-05-05 LAB — CBC
HCT: 41.4 % (ref 35.0–45.0)
Hemoglobin: 13.9 g/dL (ref 11.7–15.5)
MCH: 31.2 pg (ref 27.0–33.0)
MCHC: 33.6 g/dL (ref 32.0–36.0)
MCV: 93 fL (ref 80.0–100.0)
MPV: 10.8 fL (ref 7.5–12.5)
Platelets: 351 10*3/uL (ref 140–400)
RBC: 4.45 10*6/uL (ref 3.80–5.10)
RDW: 12.2 % (ref 11.0–15.0)
WBC: 8.8 10*3/uL (ref 3.8–10.8)

## 2020-05-05 LAB — LIPID PANEL W/REFLEX DIRECT LDL
Cholesterol: 281 mg/dL — ABNORMAL HIGH (ref <200)
HDL: 101 mg/dL (ref >=50)
LDL Cholesterol (Calc): 154 mg/dL (calc) — ABNORMAL HIGH
Non-HDL Cholesterol (Calc): 180 mg/dL (calc) — ABNORMAL HIGH (ref <130)
Total CHOL/HDL Ratio: 2.8 (calc) (ref <5.0)
Triglycerides: 137 mg/dL (ref <150)

## 2020-05-10 ENCOUNTER — Other Ambulatory Visit: Payer: Self-pay

## 2020-05-10 DIAGNOSIS — E875 Hyperkalemia: Secondary | ICD-10-CM

## 2020-05-10 NOTE — Progress Notes (Signed)
Ordered CMP per lab results.

## 2020-05-11 ENCOUNTER — Other Ambulatory Visit: Payer: Self-pay

## 2020-05-11 ENCOUNTER — Ambulatory Visit (INDEPENDENT_AMBULATORY_CARE_PROVIDER_SITE_OTHER): Payer: Medicare Other

## 2020-05-11 DIAGNOSIS — M810 Age-related osteoporosis without current pathological fracture: Secondary | ICD-10-CM

## 2020-05-11 DIAGNOSIS — Z13820 Encounter for screening for osteoporosis: Secondary | ICD-10-CM

## 2020-05-12 LAB — COMPLETE METABOLIC PANEL WITH GFR
AG Ratio: 1.5 (calc) (ref 1.0–2.5)
ALT: 15 U/L (ref 6–29)
AST: 19 U/L (ref 10–35)
Albumin: 4.1 g/dL (ref 3.6–5.1)
Alkaline phosphatase (APISO): 69 U/L (ref 37–153)
BUN/Creatinine Ratio: 26 (calc) — ABNORMAL HIGH (ref 6–22)
BUN: 25 mg/dL (ref 7–25)
CO2: 25 mmol/L (ref 20–32)
Calcium: 9.1 mg/dL (ref 8.6–10.4)
Chloride: 103 mmol/L (ref 98–110)
Creat: 0.98 mg/dL — ABNORMAL HIGH (ref 0.60–0.88)
GFR, Est African American: 62 mL/min/{1.73_m2} (ref >=60)
GFR, Est Non African American: 54 mL/min/{1.73_m2} — ABNORMAL LOW (ref >=60)
Globulin: 2.8 g/dL (calc) (ref 1.9–3.7)
Glucose, Bld: 97 mg/dL (ref 65–99)
Potassium: 4.3 mmol/L (ref 3.5–5.3)
Sodium: 138 mmol/L (ref 135–146)
Total Bilirubin: 0.5 mg/dL (ref 0.2–1.2)
Total Protein: 6.9 g/dL (ref 6.1–8.1)

## 2020-06-08 ENCOUNTER — Other Ambulatory Visit: Payer: Self-pay | Admitting: Physician Assistant

## 2020-08-10 ENCOUNTER — Other Ambulatory Visit: Payer: Self-pay

## 2020-08-10 DIAGNOSIS — F411 Generalized anxiety disorder: Secondary | ICD-10-CM

## 2020-08-10 NOTE — Telephone Encounter (Signed)
Kimberly Durham states she would be happy to cut the 1 mg in half. She states it would be cheaper for her to keep the 1 mg tablets. Please advise.

## 2020-08-10 NOTE — Telephone Encounter (Signed)
Please call patient: In the past we have discussed trying to really limit use of the lorazepam because of increased risk for dementia and falls etc.  She has done a really great job in trying to use the medication sparingly.  But I would like to decrease her dose.  I know she is due for refill so I really like for [place] to neck step would be to try to decrease her dose to the 0.5 mg instead of the 1 mg tab.  I just wanted to make her aware before I send over a refill to her pharmacy.  Please route note back to me when done.

## 2020-08-12 MED ORDER — LORAZEPAM 0.5 MG PO TABS: 0.5000 mg | ORAL_TABLET | Freq: Every day | ORAL | 1 refills | Status: DC | PRN

## 2020-10-31 LAB — HM MAMMOGRAPHY

## 2020-11-02 ENCOUNTER — Ambulatory Visit: Payer: Medicare Other | Admitting: Family Medicine

## 2020-11-09 ENCOUNTER — Encounter: Payer: Self-pay | Admitting: Family Medicine

## 2020-11-16 ENCOUNTER — Encounter: Payer: Self-pay | Admitting: Family Medicine

## 2020-11-16 ENCOUNTER — Other Ambulatory Visit: Payer: Self-pay

## 2020-11-16 ENCOUNTER — Ambulatory Visit (INDEPENDENT_AMBULATORY_CARE_PROVIDER_SITE_OTHER): Payer: Medicare Other | Admitting: Family Medicine

## 2020-11-16 VITALS — BP 138/70 | HR 82 | Ht 64.0 in | Wt 156.0 lb

## 2020-11-16 DIAGNOSIS — N1832 Chronic kidney disease, stage 3b: Secondary | ICD-10-CM | POA: Diagnosis not present

## 2020-11-16 DIAGNOSIS — M810 Age-related osteoporosis without current pathological fracture: Secondary | ICD-10-CM

## 2020-11-16 DIAGNOSIS — I700 Atherosclerosis of aorta: Secondary | ICD-10-CM

## 2020-11-16 DIAGNOSIS — R030 Elevated blood-pressure reading, without diagnosis of hypertension: Secondary | ICD-10-CM | POA: Diagnosis not present

## 2020-11-16 DIAGNOSIS — F411 Generalized anxiety disorder: Secondary | ICD-10-CM

## 2020-11-16 DIAGNOSIS — R7301 Impaired fasting glucose: Secondary | ICD-10-CM

## 2020-11-16 LAB — POCT GLYCOSYLATED HEMOGLOBIN (HGB A1C): Hemoglobin A1C: 5.6 % (ref 4.0–5.6)

## 2020-11-16 MED ORDER — ALENDRONATE SODIUM 70 MG PO TABS
70.0000 mg | ORAL_TABLET | ORAL | 11 refills | Status: DC
Start: 2020-11-16 — End: 2021-05-18

## 2020-11-16 MED ORDER — LORAZEPAM 0.5 MG PO TABS: 0.5000 mg | ORAL_TABLET | Freq: Every day | ORAL | 1 refills | Status: DC | PRN

## 2020-11-16 MED ORDER — LOSARTAN POTASSIUM 50 MG PO TABS: 50.0000 mg | ORAL_TABLET | Freq: Every day | ORAL | 1 refills | Status: DC

## 2020-11-16 NOTE — Assessment & Plan Note (Addendum)
Continue losartan in fact we can increase her dose to 50 mg.  Recheck renal function.

## 2020-11-16 NOTE — Assessment & Plan Note (Signed)
Not currently on a statin 

## 2020-11-16 NOTE — Addendum Note (Signed)
Addended by: [name] D on: 11/16/2020 02:30 PM   Modules accepted: Orders

## 2020-11-16 NOTE — Addendum Note (Signed)
Addended by: Deno Etienne on: 11/16/2020 10:00 AM   Modules accepted: Orders

## 2020-11-16 NOTE — Assessment & Plan Note (Signed)
She had tried Fosamax daily in the past and it caused some reflux.  But she says she has given it some thought and would be willing to try the weekly regimen.  Continue with adequate calcium with vitamin D intake as well as regular weightbearing exercise.

## 2020-11-16 NOTE — Assessment & Plan Note (Signed)
Did refill her medication today but she needs to use it very sparingly.

## 2020-11-16 NOTE — Assessment & Plan Note (Signed)
Her A1c looks phenomenal today in fact it is down to 5.6 which is wonderful.  Plan to recheck again in 6 months she has made some great dietary changes just encouraged her to continue with that.

## 2020-11-16 NOTE — Assessment & Plan Note (Signed)
Blood pressures have been a little bit more elevated over the last 3 months so will increase losartan to 50 mg.  Monitor for any low blood pressures.

## 2020-11-16 NOTE — Progress Notes (Addendum)
Established Patient Office Visit  Subjective:  Patient ID: Kimberly Durham, female    DOB: [birth year]  Age: 85 y.o. MRN: [id number]  CC:  Chief Complaint  Patient presents with  . IFG  . Chronic Kidney Disease    HPI Kimberly Durham presents for   Impaired fasting glucose-no increased thirst or urination. No symptoms consistent with hypoglycemia.  F/U CKD 3 -no recent changes.  Says she has cut back significantly on Posta and ice cream.  White coat HTN - Brought in home BP log.  Home blood pressure log overall looks great for October, November and December.  Pressures were running a little bit higher in January and February but things seem to have improved in March.  A couple higher pressures at the beginning of April and 142 and 144.  But no blood pressures greater than 144.  He says her brother passed away a couple of weeks ago he was having some serious health problems.  She had been partially supporting him financially for quite some time and then found out some upsetting information that he had actually been married for the last 40 years.  She has used her lorazepam a little bit more than usual because of that.  Normally a prescription lasts her several months and we had just filled it in January.  Past Medical History:  Diagnosis Date  . Anxiety   . Dyslipidemia   . Hemorrhoids   . Migraines   . Osteoporosis   . White coat hypertension     Past Surgical History:  Procedure Laterality Date  . ABDOMINAL HYSTERECTOMY    . FLEXIBLE SIGMOIDOSCOPY  08/2002   negative  . OOPHORECTOMY      Family History  Problem Relation Age of Onset  . Heart disease Mother   . Diabetes Mother   . COPD Father        smoker  . Stroke Brother 58  . Parkinsonism Brother     Social History   Socioeconomic History  . Marital status: Married    Spouse name: [name]  . Number of children: 1  . Years of education: 34  . Highest education level: High school graduate  Occupational History   . Occupation: works partime    Comment: [employer]  Tobacco Use  . Smoking status: Never Smoker  . Smokeless tobacco: Never Used  Vaping Use  . Vaping Use: Never used  Substance and Sexual Activity  . Alcohol use: Yes    Alcohol/week: 1.0 standard drink    Types: 1 Glasses of wine per week    Comment: occasionally  . Drug use: No  . Sexual activity: Yes    Partners: Male  Other Topics Concern  . Not on file  Social History Narrative   Chores around the house   Exercise   2 cups of coffee daily   Social Determinants of Health   Financial Resource Strain: Not on file  Food Insecurity: Not on file  Transportation Needs: Not on file  Physical Activity: Not on file  Stress: Not on file  Social Connections: Not on file  Intimate Partner Violence: Not on file    Outpatient Medications Prior to Visit  Medication Sig Dispense Refill  . calcium carbonate (OS-CAL) 1250 MG tablet Take 1 tablet by mouth 2 (two) times daily.    . fish oil-omega-3 fatty acids 1000 MG capsule Take 1 g by mouth 3 (three) times daily.    . Multiple Vitamin (MULTIVITAMIN) tablet Take 1 tablet by  mouth daily.    . Vitamin D, Cholecalciferol, 400 UNITS TABS Take 1 tablet by mouth daily.    Marland Kitchen LORazepam (ATIVAN) 0.5 MG tablet Take 1 tablet (0.5 mg total) by mouth daily as needed. for anxiety 30 tablet 1  . losartan (COZAAR) 25 MG tablet TAKE 1 TABLET(25 MG) BY MOUTH DAILY 90 tablet 1   No facility-administered medications prior to visit.    Allergies  Allergen Reactions  . Clindamycin Anaphylaxis and Rash    Pt states gets rash and unable to breath.  . Amoxicillin Other (See Comments)    Jaundice with elevated liver enzymes Jaundice with elevated liver enzymes Other reaction(s): Other (See Comments) Jaundice with elevated liver enzymes  . Augmentin [Amoxicillin-Pot Clavulanate]   . Codeine     REACTION: trouble breathing, palpitations  . Epinephrine     REACTION: trouble breathing, palpitations  .  Fosamax [Alendronate Sodium] Nausea Only  . Lisinopril Other (See Comments)    Cough  . Prednisone     REACTION: ?  . Sulfasalazine     Other reaction(s): Other (See Comments) REACTION: rash REACTION: rash  . Sulfonamide Derivatives     REACTION: rash    ROS Review of Systems    Objective:    Physical Exam Constitutional:      Appearance: She is well-developed.  HENT:     Head: Normocephalic and atraumatic.  Cardiovascular:     Rate and Rhythm: Normal rate and regular rhythm.     Heart sounds: Normal heart sounds.  Pulmonary:     Effort: Pulmonary effort is normal.     Breath sounds: Normal breath sounds.  Skin:    General: Skin is warm and dry.  Neurological:     Mental Status: She is alert and oriented to person, place, and time.  Psychiatric:        Behavior: Behavior normal.     BP 138/70   Pulse 82   Ht 5\' 4"  (1.626 m)   Wt 156 lb (70.8 kg)   SpO2 98%   BMI 26.78 kg/m  Wt Readings from Last 3 Encounters:  11/16/20 156 lb (70.8 kg)  05/04/20 152 lb (68.9 kg)  11/05/19 150 lb (68 kg)     There are no preventive care reminders to display for this patient.  There are no preventive care reminders to display for this patient.  Lab Results  Component Value Date   TSH 3.85 10/04/2015   Lab Results  Component Value Date   WBC 8.8 05/04/2020   HGB 13.9 05/04/2020   HCT 41.4 05/04/2020   MCV 93.0 05/04/2020   PLT 351 05/04/2020   Lab Results  Component Value Date   NA 138 05/11/2020   K 4.3 05/11/2020   CO2 25 05/11/2020   GLUCOSE 97 05/11/2020   BUN 25 05/11/2020   CREATININE 0.98 (H) 05/11/2020   BILITOT 0.5 05/11/2020   ALKPHOS 70 05/01/2017   AST 19 05/11/2020   ALT 15 05/11/2020   PROT 6.9 05/11/2020   ALBUMIN 4.2 02/06/2016   CALCIUM 9.1 05/11/2020   ANIONGAP 9 10/02/2015   Lab Results  Component Value Date   CHOL 281 (H) 05/04/2020   Lab Results  Component Value Date   HDL 101 05/04/2020   Lab Results  Component Value  Date   LDLCALC 154 (H) 05/04/2020   Lab Results  Component Value Date   TRIG 137 05/04/2020   Lab Results  Component Value Date   CHOLHDL 2.8 05/04/2020  Lab Results  Component Value Date   HGBA1C 5.6 11/16/2020      Assessment & Plan:   Problem List Items Addressed This Visit      Cardiovascular and Mediastinum   White coat syndrome with high blood pressure without hypertension    Blood pressures have been a little bit more elevated over the last 3 months so will increase losartan to 50 mg.  Monitor for any low blood pressures.      Relevant Medications   losartan (COZAAR) 50 MG tablet   Other Relevant Orders   BASIC METABOLIC PANEL WITH GFR   Aortic atherosclerosis (HCC)    Not currently on a statin.        Relevant Medications   losartan (COZAAR) 50 MG tablet     Endocrine   IFG (impaired fasting glucose) - Primary    Her A1c looks phenomenal today in fact it is down to 5.6 which is wonderful.  Plan to recheck again in 6 months she has made some great dietary changes just encouraged her to continue with that.      Relevant Orders   POCT glycosylated hemoglobin (Hb A1C) (Completed)     Musculoskeletal and Integument   Osteoporosis    She had tried Fosamax daily in the past and it caused some reflux.  But she says she has given it some thought and would be willing to try the weekly regimen.  Continue with adequate calcium with vitamin D intake as well as regular weightbearing exercise.      Relevant Medications   alendronate (FOSAMAX) 70 MG tablet     Genitourinary   Chronic kidney disease (CKD) stage G3b/A1, moderately decreased glomerular filtration rate (GFR) between 30-44 mL/min/1.73 square meter and albuminuria creatinine ratio less than 30 mg/g (HCC)    Continue losartan in fact we can increase her dose to 50 mg.  Recheck renal function.      Relevant Medications   losartan (COZAAR) 50 MG tablet   Other Relevant Orders   BASIC METABOLIC PANEL WITH  GFR     Other   GAD (generalized anxiety disorder)    Did refill her medication today but she needs to use it very sparingly.      Relevant Medications   LORazepam (ATIVAN) 0.5 MG tablet      Meds ordered this encounter  Medications  . losartan (COZAAR) 50 MG tablet    Sig: Take 1 tablet (50 mg total) by mouth daily.    Dispense:  90 tablet    Refill:  1  . LORazepam (ATIVAN) 0.5 MG tablet    Sig: Take 1 tablet (0.5 mg total) by mouth daily as needed. for anxiety    Dispense:  30 tablet    Refill:  1  . alendronate (FOSAMAX) 70 MG tablet    Sig: Take 1 tablet (70 mg total) by mouth every 7 (seven) days. Take with a full glass of water on an empty stomach.    Dispense:  4 tablet    Refill:  11    Follow-up: Return in about 6 months (around 05/18/2021) for ifg,ckd,bp.    [name], MD

## 2020-11-17 LAB — BASIC METABOLIC PANEL WITH GFR
BUN/Creatinine Ratio: 21 (calc) (ref 6–22)
BUN: 22 mg/dL (ref 7–25)
CO2: 28 mmol/L (ref 20–32)
Calcium: 9.7 mg/dL (ref 8.6–10.4)
Chloride: 101 mmol/L (ref 98–110)
Creat: 1.03 mg/dL — ABNORMAL HIGH (ref 0.60–0.88)
GFR, Est African American: 58 mL/min/{1.73_m2} — ABNORMAL LOW (ref >=60)
GFR, Est Non African American: 50 mL/min/{1.73_m2} — ABNORMAL LOW (ref >=60)
Glucose, Bld: 96 mg/dL (ref 65–99)
Potassium: 4.7 mmol/L (ref 3.5–5.3)
Sodium: 138 mmol/L (ref 135–146)

## 2021-02-23 ENCOUNTER — Other Ambulatory Visit: Payer: Self-pay | Admitting: Family Medicine

## 2021-02-23 DIAGNOSIS — F411 Generalized anxiety disorder: Secondary | ICD-10-CM

## 2021-02-24 ENCOUNTER — Other Ambulatory Visit: Payer: Self-pay

## 2021-02-24 DIAGNOSIS — F411 Generalized anxiety disorder: Secondary | ICD-10-CM

## 2021-02-24 NOTE — Telephone Encounter (Signed)
Pt states that she does indeed need a refill.  She states that she has only been taking 1/2 tablet as needed, but she has had a brother die and one is in the hospital.  Pt is agreeable to changing to clonazepam.  She was instructed to use very very sparingly.  Tiajuana Amass, CMA

## 2021-02-24 NOTE — Telephone Encounter (Signed)
Please just call patient and verify that she really needs the lorazepam refilled.  I was hoping that the 30 tabs with 1 refill would last her several months.  We really need to start weaning off this medication as it really is not considered safe in people over 75.  If she does need a refill on actually can go ahead and change it to clonazepam which is at least a little safer, and again just needs to use it very very sparingly.  Couple of times a month at the most.  I am happy to discuss with her at a visit if needed.

## 2021-02-26 ENCOUNTER — Encounter: Payer: Self-pay | Admitting: Family Medicine

## 2021-02-27 MED ORDER — CLONAZEPAM 0.5 MG PO TABS: 0.5000 mg | ORAL_TABLET | Freq: Two times a day (BID) | ORAL | 0 refills | Status: DC | PRN

## 2021-02-28 ENCOUNTER — Telehealth: Payer: Self-pay

## 2021-02-28 NOTE — Telephone Encounter (Signed)
Kimberly Durham called and states she doesn't feel comfortable with taking/switching to clonazepam. She reports that the last few months have been hard because one of her brothers passed away and her other brother had surgery. She states she did take a whole tablet of the Lorazepam some days to get her through a hard time. She states she is back on half a tablet daily. She would like to switch back to Lorazepam. She read up on the clonazepam and is concerned about this medication. She wants to wait for Dr Shelah Lewandowsky return.

## 2021-03-06 NOTE — Telephone Encounter (Signed)
Clonazepam sent to pharmacy 

## 2021-03-06 NOTE — Telephone Encounter (Signed)
No orders of the defined types were placed in this encounter. Clonazepam was sent on 8/8 as I was out of town.

## 2021-03-07 NOTE — Telephone Encounter (Signed)
I really do not want to switch her back we have had discussions about the benzodiazepines and then if she is going to take 1 then I would prefer that she is at least on a safer option so I would really like for her to try the clonazepam instead.  And always schedule a follow-up in a few weeks and address the medication further if needed.

## 2021-03-07 NOTE — Telephone Encounter (Signed)
It is true for all benzodiazepines including her Ativan that she was taking also includes the clonazepam and Valium etc.  If you are taking them regularly and stop cold [place], especially if you are on higher doses it can cause respiratory depression and death which is why we really do not want people using benzodiazepines regularly, so they do not build up that tolerance and then cannot get their medication.

## 2021-03-07 NOTE — Telephone Encounter (Signed)
FYI  I spoke with Kimberly Durham and scheduled her for an appointment to talk with Dr Linford Arnold about the Clonazepam. She wanted Dr Linford Arnold to know she is worried about the Clonazepam. She states she read that if you stop taking the Clonazepam you could stop breathing.

## 2021-03-08 NOTE — Telephone Encounter (Signed)
Patient advised.

## 2021-03-21 ENCOUNTER — Ambulatory Visit: Payer: Medicare Other | Admitting: Family Medicine

## 2021-05-18 ENCOUNTER — Encounter: Payer: Self-pay | Admitting: Family Medicine

## 2021-05-18 ENCOUNTER — Ambulatory Visit (INDEPENDENT_AMBULATORY_CARE_PROVIDER_SITE_OTHER): Payer: Medicare Other | Admitting: Family Medicine

## 2021-05-18 VITALS — BP 148/68 | HR 71 | Ht 64.0 in | Wt 157.0 lb

## 2021-05-18 DIAGNOSIS — I700 Atherosclerosis of aorta: Secondary | ICD-10-CM

## 2021-05-18 DIAGNOSIS — R809 Proteinuria, unspecified: Secondary | ICD-10-CM

## 2021-05-18 DIAGNOSIS — N1832 Chronic kidney disease, stage 3b: Secondary | ICD-10-CM | POA: Diagnosis not present

## 2021-05-18 DIAGNOSIS — R030 Elevated blood-pressure reading, without diagnosis of hypertension: Secondary | ICD-10-CM

## 2021-05-18 DIAGNOSIS — R7301 Impaired fasting glucose: Secondary | ICD-10-CM | POA: Diagnosis not present

## 2021-05-18 DIAGNOSIS — E785 Hyperlipidemia, unspecified: Secondary | ICD-10-CM

## 2021-05-18 DIAGNOSIS — T50905A Adverse effect of unspecified drugs, medicaments and biological substances, initial encounter: Secondary | ICD-10-CM

## 2021-05-18 DIAGNOSIS — M810 Age-related osteoporosis without current pathological fracture: Secondary | ICD-10-CM

## 2021-05-18 LAB — POCT GLYCOSYLATED HEMOGLOBIN (HGB A1C): Hemoglobin A1C: 5.6 % (ref 4.0–5.6)

## 2021-05-18 MED ORDER — IBANDRONATE SODIUM 150 MG PO TABS: 150.0000 mg | ORAL_TABLET | ORAL | 0 refills | Status: DC

## 2021-05-18 MED ORDER — LOSARTAN POTASSIUM 100 MG PO TABS: 100.0000 mg | ORAL_TABLET | Freq: Every day | ORAL | 1 refills | Status: DC

## 2021-05-18 NOTE — Assessment & Plan Note (Signed)
Not currently on a statin.  She had tried them in the past and had myalgias.

## 2021-05-18 NOTE — Assessment & Plan Note (Signed)
List options.  We increase losartan to 100 mg.

## 2021-05-18 NOTE — Progress Notes (Signed)
Established Patient Office Visit  Subjective:  Patient ID: Kimberly Durham, female    DOB: 1935-11-30  Age: 85 y.o. MRN: [id number]  CC:  Chief Complaint  Patient presents with   ifg   Hypertension    HPI Kimberly Durham presents for   Hypertension- Pt denies chest pain, SOB, dizziness, or heart palpitations.  Taking meds as directed w/o problems.  Denies medication side effects.    Impaired fasting glucose-no increased thirst or urination. No symptoms consistent with hypoglycemia.  F/U CKD 3 -no recent changes.  Not currently taking an NSAID.  Had COVD  in Sept. she says the symptoms were mild her vaccines were up-to-date.  She wants to know how soon she can get the next COVID by valent booster.  She did try the alendronate and says to take it once and it caused some nausea and stomach upset so she stopped taking it.  That was back in May.  Wants to talk about her weight.   Past Medical History:  Diagnosis Date   Anxiety    Dyslipidemia    Hemorrhoids    Migraines    Osteoporosis    White coat hypertension     Past Surgical History:  Procedure Laterality Date   ABDOMINAL HYSTERECTOMY     FLEXIBLE SIGMOIDOSCOPY  08/2002   negative   OOPHORECTOMY      Family History  Problem Relation Age of Onset   Heart disease Mother    Diabetes Mother    COPD Father        smoker   Stroke Brother 58   Parkinsonism Brother     Social History   Socioeconomic History   Marital status: Married    Spouse name: [name]   Number of children: 1   Years of education: 12   Highest education level: High school graduate  Occupational History   Occupation: works partime    Comment: [employer]  Tobacco Use   Smoking status: Never   Smokeless tobacco: Never  Vaping Use   Vaping Use: Never used  Substance and Sexual Activity   Alcohol use: Yes    Alcohol/week: 1.0 standard drink    Types: 1 Glasses of wine per week    Comment: occasionally   Drug use: No   Sexual activity: Yes     Partners: Male  Other Topics Concern   Not on file  Social History Narrative   Chores around the house   Exercise   2 cups of coffee daily   Social Determinants of Health   Financial Resource Strain: Not on file  Food Insecurity: Not on file  Transportation Needs: Not on file  Physical Activity: Not on file  Stress: Not on file  Social Connections: Not on file  Intimate Partner Violence: Not on file    Outpatient Medications Prior to Visit  Medication Sig Dispense Refill   calcium carbonate (OS-CAL) 1250 MG tablet Take 1 tablet by mouth 2 (two) times daily.     clonazePAM (KLONOPIN) 0.5 MG tablet Take 1 tablet (0.5 mg total) by mouth 2 (two) times daily as needed for anxiety. 45 tablet 0   fish oil-omega-3 fatty acids 1000 MG capsule Take 1 g by mouth 3 (three) times daily.     Multiple Vitamin (MULTIVITAMIN) tablet Take 1 tablet by mouth daily.     Vitamin D, Cholecalciferol, 400 UNITS TABS Take 1 tablet by mouth daily.     alendronate (FOSAMAX) 70 MG tablet Take 1 tablet (70 mg total)  by mouth every 7 (seven) days. Take with a full glass of water on an empty stomach. 4 tablet 11   losartan (COZAAR) 50 MG tablet Take 1 tablet (50 mg total) by mouth daily. 90 tablet 1   No facility-administered medications prior to visit.    Allergies  Allergen Reactions   Clindamycin Anaphylaxis and Rash    Pt states gets rash and unable to breath.   Amoxicillin Other (See Comments)    Jaundice with elevated liver enzymes Jaundice with elevated liver enzymes Other reaction(s): Other (See Comments) Jaundice with elevated liver enzymes   Augmentin [Amoxicillin-Pot Clavulanate]    Codeine     REACTION: trouble breathing, palpitations   Epinephrine     REACTION: trouble breathing, palpitations   Fosamax [Alendronate Sodium] Nausea Only   Lisinopril Other (See Comments)    Cough   Prednisone     REACTION: ?   Statins Other (See Comments)    Myalgias   Sulfasalazine     Other  reaction(s): Other (See Comments) REACTION: rash REACTION: rash   Sulfonamide Derivatives     REACTION: rash    ROS Review of Systems    Objective:    Physical Exam Constitutional:      Appearance: Normal appearance. She is well-developed.  HENT:     Head: Normocephalic and atraumatic.  Cardiovascular:     Rate and Rhythm: Normal rate and regular rhythm.     Heart sounds: Normal heart sounds.  Pulmonary:     Effort: Pulmonary effort is normal.     Breath sounds: Normal breath sounds.  Skin:    General: Skin is warm and dry.  Neurological:     Mental Status: She is alert and oriented to person, place, and time.  Psychiatric:        Behavior: Behavior normal.    BP (!) 148/68   Pulse 71   Ht 5\' 4"  (1.626 m)   Wt 157 lb (71.2 kg)   SpO2 99%   BMI 26.95 kg/m  Wt Readings from Last 3 Encounters:  05/18/21 157 lb (71.2 kg)  11/16/20 156 lb (70.8 kg)  05/04/20 152 lb (68.9 kg)     Health Maintenance Due  Topic Date Due   Zoster Vaccines- Shingrix (1 of 2) Never done    There are no preventive care reminders to display for this patient.  Lab Results  Component Value Date   TSH 3.85 10/04/2015   Lab Results  Component Value Date   WBC 8.8 05/04/2020   HGB 13.9 05/04/2020   HCT 41.4 05/04/2020   MCV 93.0 05/04/2020   PLT 351 05/04/2020   Lab Results  Component Value Date   NA 138 11/16/2020   K 4.7 11/16/2020   CO2 28 11/16/2020   GLUCOSE 96 11/16/2020   BUN 22 11/16/2020   CREATININE 1.03 (H) 11/16/2020   BILITOT 0.5 05/11/2020   ALKPHOS 70 05/01/2017   AST 19 05/11/2020   ALT 15 05/11/2020   PROT 6.9 05/11/2020   ALBUMIN 4.2 02/06/2016   CALCIUM 9.7 11/16/2020   ANIONGAP 9 10/02/2015   Lab Results  Component Value Date   CHOL 281 (H) 05/04/2020   Lab Results  Component Value Date   HDL 101 05/04/2020   Lab Results  Component Value Date   LDLCALC 154 (H) 05/04/2020   Lab Results  Component Value Date   TRIG 137 05/04/2020   Lab  Results  Component Value Date   CHOLHDL 2.8 05/04/2020  Lab Results  Component Value Date   HGBA1C 5.6 05/18/2021      Assessment & Plan:   Problem List Items Addressed This Visit       Cardiovascular and Mediastinum   White coat syndrome with high blood pressure without hypertension    List options.  We increase losartan to 100 mg.      Relevant Medications   losartan (COZAAR) 100 MG tablet   Aortic atherosclerosis (HCC)    Not currently on a statin.  She had tried them in the past and had myalgias.      Relevant Medications   losartan (COZAAR) 100 MG tablet   Other Relevant Orders   COMPLETE METABOLIC PANEL WITH GFR   Lipid Panel w/reflex Direct LDL   CBC   Microalbumin, urine     Endocrine   IFG (impaired fasting glucose) - Primary    1C looks great today at 5.6.  Continue current regimen.  Follow-up in 6 months.      Relevant Orders   POCT glycosylated hemoglobin (Hb A1C) (Completed)   COMPLETE METABOLIC PANEL WITH GFR   Lipid Panel w/reflex Direct LDL   CBC   Microalbumin, urine     Musculoskeletal and Integument   Osteoporosis    She did not tolerate the alendronate but would like to try another oral pill if possible so we can try Boniva instead.  We also discussed Prolia as an option.      Relevant Medications   ibandronate (BONIVA) 150 MG tablet     Genitourinary   Chronic kidney disease (CKD) stage G3b/A1, moderately decreased glomerular filtration rate (GFR) between 30-44 mL/min/1.73 square meter and albuminuria creatinine ratio less than 30 mg/g (HCC)    Continue to follow renal function every 6 months.      Relevant Medications   losartan (COZAAR) 100 MG tablet   Other Relevant Orders   COMPLETE METABOLIC PANEL WITH GFR   Lipid Panel w/reflex Direct LDL   CBC   Microalbumin, urine     Other   Microalbuminuria   Relevant Orders   COMPLETE METABOLIC PANEL WITH GFR   Lipid Panel w/reflex Direct LDL   CBC   Microalbumin, urine    Dyslipidemia    Due to recheck lipids.      Other Visit Diagnoses     Medication side effect, initial encounter           Meds ordered this encounter  Medications   losartan (COZAAR) 100 MG tablet    Sig: Take 1 tablet (100 mg total) by mouth daily.    Dispense:  90 tablet    Refill:  1   ibandronate (BONIVA) 150 MG tablet    Sig: Take 1 tablet (150 mg total) by mouth every 30 (thirty) days. Take in the morning with a full glass of water, on an empty stomach, and do not take anything else by mouth or lie down for the next 30 min.    Dispense:  1 tablet    Refill:  0     Follow-up: Return in about 6 months (around 11/16/2021) for Hypertension.    [name], MD

## 2021-05-18 NOTE — Assessment & Plan Note (Signed)
1C looks great today at 5.6.  Continue current regimen.  Follow-up in 6 months.

## 2021-05-18 NOTE — Assessment & Plan Note (Signed)
Continue to follow renal function every 6 months. 

## 2021-05-18 NOTE — Assessment & Plan Note (Signed)
Due to recheck lipids. 

## 2021-05-18 NOTE — Assessment & Plan Note (Signed)
She did not tolerate the alendronate but would like to try another oral pill if possible so we can try Boniva instead.  We also discussed Prolia as an option.

## 2021-05-19 LAB — COMPLETE METABOLIC PANEL WITH GFR
AG Ratio: 1.5 (calc) (ref 1.0–2.5)
ALT: 16 U/L (ref 6–29)
AST: 18 U/L (ref 10–35)
Albumin: 4.4 g/dL (ref 3.6–5.1)
Alkaline phosphatase (APISO): 62 U/L (ref 37–153)
BUN/Creatinine Ratio: 26 (calc) — ABNORMAL HIGH (ref 6–22)
BUN: 25 mg/dL (ref 7–25)
CO2: 28 mmol/L (ref 20–32)
Calcium: 10.1 mg/dL (ref 8.6–10.4)
Chloride: 100 mmol/L (ref 98–110)
Creat: 0.96 mg/dL — ABNORMAL HIGH (ref 0.60–0.95)
Globulin: 3 g/dL (calc) (ref 1.9–3.7)
Glucose, Bld: 100 mg/dL — ABNORMAL HIGH (ref 65–99)
Potassium: 4.5 mmol/L (ref 3.5–5.3)
Sodium: 137 mmol/L (ref 135–146)
Total Bilirubin: 0.9 mg/dL (ref 0.2–1.2)
Total Protein: 7.4 g/dL (ref 6.1–8.1)
eGFR: 59 mL/min/{1.73_m2} — ABNORMAL LOW (ref >=60)

## 2021-05-19 LAB — LIPID PANEL W/REFLEX DIRECT LDL
Cholesterol: 272 mg/dL — ABNORMAL HIGH (ref <200)
HDL: 100 mg/dL (ref >=50)
LDL Cholesterol (Calc): 142 mg/dL (calc) — ABNORMAL HIGH
Non-HDL Cholesterol (Calc): 172 mg/dL (calc) — ABNORMAL HIGH (ref <130)
Total CHOL/HDL Ratio: 2.7 (calc) (ref <5.0)
Triglycerides: 161 mg/dL — ABNORMAL HIGH (ref <150)

## 2021-05-19 LAB — CBC
HCT: 41 % (ref 35.0–45.0)
Hemoglobin: 13.6 g/dL (ref 11.7–15.5)
MCH: 30.6 pg (ref 27.0–33.0)
MCHC: 33.2 g/dL (ref 32.0–36.0)
MCV: 92.3 fL (ref 80.0–100.0)
MPV: 10.5 fL (ref 7.5–12.5)
Platelets: 350 10*3/uL (ref 140–400)
RBC: 4.44 10*6/uL (ref 3.80–5.10)
RDW: 12.6 % (ref 11.0–15.0)
WBC: 8.4 10*3/uL (ref 3.8–10.8)

## 2021-05-19 LAB — MICROALBUMIN, URINE: Microalb, Ur: 0.6 mg/dL

## 2021-05-19 NOTE — Progress Notes (Signed)
Hi Kimberly Durham, your kidney function is stable.  Your liver enzymes are normal. Your cholesterol is till high but a little better this year.  Your blood count is normal. Urine sample OK as well.

## 2021-06-09 ENCOUNTER — Telehealth: Payer: Self-pay

## 2021-06-09 NOTE — Telephone Encounter (Signed)
Kimberly Durham states her blood pressure has been fine. Mostly 118/62. She states she keeps an eye on her blood pressure. She did report when the pounding in her ears was really bad her blood pressure was 167/62. She took half a clonazepam and her blood pressure went back to normal range. She states she feels fine today.

## 2021-06-09 NOTE — Telephone Encounter (Signed)
Kimberly Durham states she believes she is having a reaction to the [place]. She reports pounding in her ears, heartburn and feeling a little weak and shaky. She did take Zantac and it helped with the heartburn. She wanted to know if there was anything she can take to counter the side effects.   She reports taking Fosamax for years and it always made her feel nauseous.

## 2021-06-09 NOTE — Telephone Encounter (Signed)
She can also use Tums for the heartburn and eat a really bland diet today and stay away from caffeine and acidic foods.  I am a little concerned about the weakness and the ear pounding.  Can she take her blood pressure?

## 2021-06-13 ENCOUNTER — Encounter: Payer: Self-pay | Admitting: Family Medicine

## 2021-06-14 NOTE — Telephone Encounter (Signed)
Patient scheduled.

## 2021-06-19 ENCOUNTER — Ambulatory Visit (INDEPENDENT_AMBULATORY_CARE_PROVIDER_SITE_OTHER): Payer: Medicare Other | Admitting: Family Medicine

## 2021-06-19 ENCOUNTER — Encounter: Payer: Self-pay | Admitting: Family Medicine

## 2021-06-19 ENCOUNTER — Other Ambulatory Visit: Payer: Self-pay

## 2021-06-19 VITALS — BP 182/88 | HR 87 | Ht 64.0 in | Wt 158.0 lb

## 2021-06-19 DIAGNOSIS — R002 Palpitations: Secondary | ICD-10-CM

## 2021-06-19 DIAGNOSIS — R110 Nausea: Secondary | ICD-10-CM

## 2021-06-19 DIAGNOSIS — T50905A Adverse effect of unspecified drugs, medicaments and biological substances, initial encounter: Secondary | ICD-10-CM

## 2021-06-19 DIAGNOSIS — R519 Headache, unspecified: Secondary | ICD-10-CM

## 2021-06-19 DIAGNOSIS — I10 Essential (primary) hypertension: Secondary | ICD-10-CM

## 2021-06-19 DIAGNOSIS — I517 Cardiomegaly: Secondary | ICD-10-CM

## 2021-06-19 LAB — COMPLETE METABOLIC PANEL WITH GFR
AG Ratio: 1.3 (calc) (ref 1.0–2.5)
ALT: 18 U/L (ref 6–29)
AST: 20 U/L (ref 10–35)
Albumin: 4.5 g/dL (ref 3.6–5.1)
Alkaline phosphatase (APISO): 75 U/L (ref 37–153)
BUN: 25 mg/dL (ref 7–25)
CO2: 28 mmol/L (ref 20–32)
Calcium: 9.6 mg/dL (ref 8.6–10.4)
Chloride: 103 mmol/L (ref 98–110)
Creat: 0.9 mg/dL (ref 0.60–0.95)
Globulin: 3.4 g/dL (calc) (ref 1.9–3.7)
Glucose, Bld: 77 mg/dL (ref 65–99)
Potassium: 4.4 mmol/L (ref 3.5–5.3)
Sodium: 139 mmol/L (ref 135–146)
Total Bilirubin: 0.6 mg/dL (ref 0.2–1.2)
Total Protein: 7.9 g/dL (ref 6.1–8.1)
eGFR: 63 mL/min/{1.73_m2} (ref >=60)

## 2021-06-19 LAB — CBC
HCT: 41.8 % (ref 35.0–45.0)
Hemoglobin: 14 g/dL (ref 11.7–15.5)
MCH: 31 pg (ref 27.0–33.0)
MCHC: 33.5 g/dL (ref 32.0–36.0)
MCV: 92.5 fL (ref 80.0–100.0)
MPV: 10.6 fL (ref 7.5–12.5)
Platelets: 354 10*3/uL (ref 140–400)
RBC: 4.52 10*6/uL (ref 3.80–5.10)
RDW: 12.8 % (ref 11.0–15.0)
WBC: 8.5 10*3/uL (ref 3.8–10.8)

## 2021-06-19 NOTE — Progress Notes (Signed)
Established Patient Office Visit  Subjective:  Patient ID: Kimberly Durham, female    DOB: 06/27/1936  Age: 85 y.o. MRN: [id number]  CC:  Chief Complaint  Patient presents with   Hypertension   Nausea    HPI Kimberly Durham presents for   Hypertension- Pt denies chest pain, SOB, dizziness, or heart palpitations.  Taking meds as directed w/o problems.  Denies medication side effects.    Having nausea , stomach problems and BP spikes since starting Boniva.  Says she is still having diarrhea.  She took the [place] on the seventh.  And within about a week started having severe headaches and nausea and pounding in her ears.  She started checking her blood pressure and numbers were all over the place some of the blood pressures look fantastic some of them were very high in the 170s and 180s.  By November 20 if she was still having her pounding shaking and teeth chattering she started taking half a tab of clonazepam at night and in the morning to try to see if that would help she feels like it did take the edge off a little bit.  She feels like it is getting a little better this week compared to last week its not as intense but it is still happening.   Past Medical History:  Diagnosis Date   Anxiety    Dyslipidemia    Hemorrhoids    Migraines    Osteoporosis    White coat hypertension     Past Surgical History:  Procedure Laterality Date   ABDOMINAL HYSTERECTOMY     FLEXIBLE SIGMOIDOSCOPY  08/2002   negative   OOPHORECTOMY      Family History  Problem Relation Age of Onset   Heart disease Mother    Diabetes Mother    COPD Father        smoker   Stroke Brother 1   Parkinsonism Brother     Social History   Socioeconomic History   Marital status: Married    Spouse name: Juanda Crumble   Number of children: 1   Years of education: 12   Highest education level: High school graduate  Occupational History   Occupation: works partime    Comment: [employer]  Tobacco Use   Smoking status:  Never   Smokeless tobacco: Never  Vaping Use   Vaping Use: Never used  Substance and Sexual Activity   Alcohol use: Yes    Alcohol/week: 1.0 standard drink    Types: 1 Glasses of wine per week    Comment: occasionally   Drug use: No   Sexual activity: Yes    Partners: Male  Other Topics Concern   Not on file  Social History Narrative   Chores around the house   Exercise   2 cups of coffee daily   Social Determinants of Health   Financial Resource Strain: Not on file  Food Insecurity: Not on file  Transportation Needs: Not on file  Physical Activity: Not on file  Stress: Not on file  Social Connections: Not on file  Intimate Partner Violence: Not on file    Outpatient Medications Prior to Visit  Medication Sig Dispense Refill   calcium carbonate (OS-CAL) 1250 MG tablet Take 1 tablet by mouth 2 (two) times daily.     clonazePAM (KLONOPIN) 0.5 MG tablet Take 1 tablet (0.5 mg total) by mouth 2 (two) times daily as needed for anxiety. 45 tablet 0   fish oil-omega-3 fatty acids 1000 MG capsule  Take 1 g by mouth 3 (three) times daily.     losartan (COZAAR) 100 MG tablet Take 1 tablet (100 mg total) by mouth daily. 90 tablet 1   Multiple Vitamin (MULTIVITAMIN) tablet Take 1 tablet by mouth daily.     Vitamin D, Cholecalciferol, 400 UNITS TABS Take 1 tablet by mouth daily.     ibandronate (BONIVA) 150 MG tablet Take 1 tablet (150 mg total) by mouth every 30 (thirty) days. Take in the morning with a full glass of water, on an empty stomach, and do not take anything else by mouth or lie down for the next 30 min. 1 tablet 0   No facility-administered medications prior to visit.    Allergies  Allergen Reactions   Clindamycin Anaphylaxis and Rash    Pt states gets rash and unable to breath.   Amoxicillin Other (See Comments)    Jaundice with elevated liver enzymes Jaundice with elevated liver enzymes Other reaction(s): Other (See Comments) Jaundice with elevated liver enzymes    Augmentin [Amoxicillin-Pot Clavulanate]    Boniva [Ibandronic Acid] Other (See Comments)    HTN, heart pounding, nausea.     Codeine     REACTION: trouble breathing, palpitations   Epinephrine     REACTION: trouble breathing, palpitations   Fosamax [Alendronate Sodium] Nausea Only   Lisinopril Other (See Comments)    Cough   Prednisone     REACTION: ?   Statins Other (See Comments)    Myalgias   Sulfasalazine     Other reaction(s): Other (See Comments) REACTION: rash REACTION: rash   Sulfonamide Derivatives     REACTION: rash    ROS Review of Systems    Objective:    Physical Exam Constitutional:      Appearance: Normal appearance. She is well-developed.  HENT:     Head: Normocephalic and atraumatic.  Cardiovascular:     Rate and Rhythm: Normal rate and regular rhythm.     Heart sounds: Normal heart sounds.  Pulmonary:     Effort: Pulmonary effort is normal.     Breath sounds: Normal breath sounds.  Skin:    General: Skin is warm and dry.  Neurological:     Mental Status: She is alert and oriented to person, place, and time.  Psychiatric:        Behavior: Behavior normal.    BP (!) 182/88   Pulse 87   Ht 5' 4"  (1.626 m)   Wt 158 lb (71.7 kg)   SpO2 99%   BMI 27.12 kg/m  Wt Readings from Last 3 Encounters:  06/19/21 158 lb (71.7 kg)  05/18/21 157 lb (71.2 kg)  11/16/20 156 lb (70.8 kg)     There are no preventive care reminders to display for this patient.  There are no preventive care reminders to display for this patient.  Lab Results  Component Value Date   TSH 3.85 10/04/2015   Lab Results  Component Value Date   WBC 8.5 06/19/2021   HGB 14.0 06/19/2021   HCT 41.8 06/19/2021   MCV 92.5 06/19/2021   PLT 354 06/19/2021   Lab Results  Component Value Date   NA 139 06/19/2021   K 4.4 06/19/2021   CO2 28 06/19/2021   GLUCOSE 77 06/19/2021   BUN 25 06/19/2021   CREATININE 0.90 06/19/2021   BILITOT 0.6 06/19/2021   ALKPHOS 70  05/01/2017   AST 20 06/19/2021   ALT 18 06/19/2021   PROT 7.9 06/19/2021   ALBUMIN  4.2 02/06/2016   CALCIUM 9.6 06/19/2021   ANIONGAP 9 10/02/2015   EGFR 63 06/19/2021   Lab Results  Component Value Date   CHOL 272 (H) 05/18/2021   Lab Results  Component Value Date   HDL 100 05/18/2021   Lab Results  Component Value Date   LDLCALC 142 (H) 05/18/2021   Lab Results  Component Value Date   TRIG 161 (H) 05/18/2021   Lab Results  Component Value Date   CHOLHDL 2.7 05/18/2021   Lab Results  Component Value Date   HGBA1C 5.6 05/18/2021      Assessment & Plan:   Problem List Items Addressed This Visit       Cardiovascular and Mediastinum   White coat syndrome with hypertension    Hypertension is uncontrolled today it sounds like it is really been up-and-down and fluctuating pretty wildly since taking the Boniva if this is a medication side effect, which it certainly could be, then it should improve over the next 2 weeks.  But do want her to keep a close eye on it and continue with her losartan.  Taking a half a tab of her clonazepam does seem to help as well.  We could even consider adding a low-dose beta-blocker until the blood pressure improves.      Relevant Medications   metoprolol succinate (TOPROL-XL) 25 MG 24 hr tablet   Other Visit Diagnoses     Medication side effect, initial encounter    -  Primary   Pounding heartbeat       Relevant Orders   CBC (Completed)   COMPLETE METABOLIC PANEL WITH GFR (Completed)   ECHOCARDIOGRAM COMPLETE   Nausea       Relevant Orders   CBC (Completed)   COMPLETE METABOLIC PANEL WITH GFR (Completed)   Acute nonintractable headache, unspecified headache type       Relevant Medications   metoprolol succinate (TOPROL-XL) 25 MG 24 hr tablet   Other Relevant Orders   CBC (Completed)   COMPLETE METABOLIC PANEL WITH GFR (Completed)   LVH (left ventricular hypertrophy)       Relevant Medications   metoprolol succinate  (TOPROL-XL) 25 MG 24 hr tablet   Other Relevant Orders   ECHOCARDIOGRAM COMPLETE       EKG today shows rate of 65 bpm with LVH that was not there previously.  LVH, new on EKG compared to prior.  We will go ahead and refer for echocardiogram for further work-up.  Heart pounding/nausea-certainly could be side effects from the [facility] as well as the irregularities of her blood pressure.  Should hopefully improve over the next couple of weeks if it is a medication side effect.  Continue to take her blood pressure medication and then clonazepam as needed but do want her to be very careful about sedation.  She will continue to monitor her blood pressure at home.  Meds ordered this encounter  Medications   metoprolol succinate (TOPROL-XL) 25 MG 24 hr tablet    Sig: Take 1 tablet (25 mg total) by mouth daily as needed.    Dispense:  30 tablet    Refill:  0     Follow-up: Return if symptoms worsen or fail to improve.    Beatrice Lecher, MD

## 2021-06-19 NOTE — Progress Notes (Signed)
Your lab work is within acceptable range and there are no concerning findings.   ?

## 2021-06-20 ENCOUNTER — Telehealth: Payer: Self-pay | Admitting: Family Medicine

## 2021-06-20 MED ORDER — METOPROLOL SUCCINATE ER 25 MG PO TB24: 25.0000 mg | ORAL_TABLET | Freq: Every day | ORAL | 0 refills | Status: DC | PRN

## 2021-06-20 NOTE — Assessment & Plan Note (Signed)
Hypertension is uncontrolled today it sounds like it is really been up-and-down and fluctuating pretty wildly since taking the Boniva if this is a medication side effect, which it certainly could be, then it should improve over the next 2 weeks.  But do want her to keep a close eye on it and continue with her losartan.  Taking a half a tab of her clonazepam does seem to help as well.  We could even consider adding a low-dose beta-blocker until the blood pressure improves.

## 2021-06-20 NOTE — Telephone Encounter (Signed)
Please call patient and let her daughter know I did send over prescription for medication called metoprolol to take as needed if her blood pressures greater than 150.  If it is less than that the not to take it.  I do want her to continue to take her losartan every day.  But if the blood pressure shooting up and I do want to have her take something extra to try to bring it down to its not spiking up and down so fast which can increase her risk for stroke.  Hopefully she only need to use this for couple of weeks.  But I did send over 30 tabs just in case

## 2021-06-21 NOTE — Telephone Encounter (Signed)
Pt advised of recommendations.   She wanted Dr. Linford Arnold to know that she hasn't had anymore heart burn since she has stopped the [place]. She feels much better.

## 2021-06-27 NOTE — Addendum Note (Signed)
Addended by: Chalmers Cater on: 06/27/2021 08:08 AM   Modules accepted: Orders

## 2021-06-29 ENCOUNTER — Other Ambulatory Visit: Payer: Self-pay | Admitting: *Deleted

## 2021-06-30 NOTE — Addendum Note (Signed)
Addended by: Donne Anon L on: 06/30/2021 11:51 AM   Modules accepted: Orders

## 2021-07-26 ENCOUNTER — Other Ambulatory Visit: Payer: Self-pay | Admitting: Physician Assistant

## 2021-08-04 ENCOUNTER — Ambulatory Visit (HOSPITAL_BASED_OUTPATIENT_CLINIC_OR_DEPARTMENT_OTHER)
Admission: RE | Admit: 2021-08-04 | Discharge: 2021-08-04 | Disposition: A | Discharge status: Home / Self Care | Payer: Medicare Other | Source: Ambulatory Visit | Attending: Family Medicine | Admitting: Family Medicine

## 2021-08-04 ENCOUNTER — Other Ambulatory Visit: Payer: Self-pay

## 2021-08-04 DIAGNOSIS — R002 Palpitations: Secondary | ICD-10-CM | POA: Diagnosis present

## 2021-08-04 DIAGNOSIS — I517 Cardiomegaly: Secondary | ICD-10-CM | POA: Diagnosis present

## 2021-08-04 LAB — ECHOCARDIOGRAM COMPLETE
AR max vel: 2.83 cm2
AV Area VTI: 2.88 cm2
AV Area mean vel: 2.6 cm2
AV Mean grad: 8.7 mmHg
AV Peak grad: 14.6 mmHg
Ao pk vel: 1.91 m/s
Area-P 1/2: 2.23 cm2
Calc EF: 71.8 %
MV VTI: 3.81 cm2
S' Lateral: 2.6 cm
Single Plane A2C EF: 67.9 %
Single Plane A4C EF: 75.7 %

## 2021-08-04 NOTE — Progress Notes (Signed)
Hi Navneet,  Your echocardiogram of your heart overall looks great!  Pumping function is normal.  Motion of the walls is also normal.  There is just some slight abnormal relaxation of the heart muscle but nothing concerning or worrisome.  The valves also look wonderful.  Again no concerning findings on exam.

## 2021-08-15 ENCOUNTER — Other Ambulatory Visit: Payer: Self-pay

## 2021-08-15 ENCOUNTER — Ambulatory Visit (INDEPENDENT_AMBULATORY_CARE_PROVIDER_SITE_OTHER): Payer: Medicare Other | Admitting: Sports Medicine

## 2021-08-15 DIAGNOSIS — M65311 Trigger thumb, right thumb: Secondary | ICD-10-CM

## 2021-08-15 MED ORDER — MELOXICAM 15 MG PO TABS: ORAL_TABLET | ORAL | 3 refills | Status: DC

## 2021-08-15 NOTE — Progress Notes (Signed)
° ° °  Procedures performed today:    None.  Independent interpretation of notes and tests performed by another provider:   None.  Brief History, Exam, Impression, and Recommendations:    Trigger thumb of right hand This is a very pleasant 86 year old female, for the past couple of days she has had some painful triggering right thumb, triggering and pain occurs on the volar aspect proximal to the MCP. On exam she has visible and palpable triggering, I explained the anatomy, pathophysiology of digit triggering, we will start with meloxicam for 2 weeks, home conditioning, return to see me in a month, flexor pollicis longus tendon sheath injection if no better.    ___________________________________________ Ihor Austin. Benjamin Stain, M.D., ABFM., CAQSM. Primary Care and Sports Medicine [facility]  Adjunct Instructor of Family Medicine  [employer]

## 2021-08-15 NOTE — Assessment & Plan Note (Signed)
This is a very pleasant 86 year old female, for the past couple of days she has had some painful triggering right thumb, triggering and pain occurs on the volar aspect proximal to the MCP. On exam she has visible and palpable triggering, I explained the anatomy, pathophysiology of digit triggering, we will start with meloxicam for 2 weeks, home conditioning, return to see me in a month, flexor pollicis longus tendon sheath injection if no better.

## 2021-09-06 ENCOUNTER — Other Ambulatory Visit: Payer: Self-pay | Admitting: Family Medicine

## 2021-09-06 DIAGNOSIS — M810 Age-related osteoporosis without current pathological fracture: Secondary | ICD-10-CM

## 2021-09-12 ENCOUNTER — Ambulatory Visit (INDEPENDENT_AMBULATORY_CARE_PROVIDER_SITE_OTHER): Payer: Medicare Other

## 2021-09-12 ENCOUNTER — Ambulatory Visit (INDEPENDENT_AMBULATORY_CARE_PROVIDER_SITE_OTHER): Payer: Medicare Other | Admitting: Sports Medicine

## 2021-09-12 ENCOUNTER — Other Ambulatory Visit: Payer: Self-pay

## 2021-09-12 DIAGNOSIS — M65311 Trigger thumb, right thumb: Secondary | ICD-10-CM

## 2021-09-12 NOTE — Assessment & Plan Note (Signed)
This is a pleasant 86 year old female, trigger thumb, at this point she has had greater than 6 weeks of conservative treatment without improvement, we did a flexor pollicis longus tendon sheath injection today, return to see me in a month.

## 2021-09-12 NOTE — Progress Notes (Signed)
° ° °  Procedures performed today:    Procedure: Real-time Ultrasound Guided injection of the right flexor pollicis longus tendon sheath Device: Samsung [id number]  Verbal informed consent obtained.  Time-out conducted.  Noted no overlying erythema, induration, or other signs of local infection.  Skin prepped in a sterile fashion.  Local anesthesia: Topical Ethyl chloride.  With sterile technique and under real time ultrasound guidance: Right flexor tendon sheath nodule noted, 25-gauge needle advanced into the flexor pollicis longus tendon sheath, injected 1/2 cc kenalog 40, 1/2 cc lidocaine. Advised to call if fevers/chills, erythema, induration, drainage, or persistent bleeding.  Images permanently stored and available for review in PACS.  Impression: Technically successful ultrasound guided injection.  Independent interpretation of notes and tests performed by another provider:   None.  Brief History, Exam, Impression, and Recommendations:    Trigger thumb of right hand This is a pleasant 86 year old female, trigger thumb, at this point she has had greater than 6 weeks of conservative treatment without improvement, we did a flexor pollicis longus tendon sheath injection today, return to see me in a month.    ___________________________________________ Ihor Austin. Benjamin Stain, M.D., ABFM., CAQSM. Primary Care and Sports Medicine [facility]  Adjunct Instructor of Family Medicine  [employer]

## 2021-10-10 ENCOUNTER — Other Ambulatory Visit: Payer: Self-pay

## 2021-10-10 ENCOUNTER — Ambulatory Visit (INDEPENDENT_AMBULATORY_CARE_PROVIDER_SITE_OTHER): Payer: Medicare Other | Admitting: Sports Medicine

## 2021-10-10 DIAGNOSIS — M65311 Trigger thumb, right thumb: Secondary | ICD-10-CM | POA: Diagnosis not present

## 2021-10-10 NOTE — Assessment & Plan Note (Signed)
Pleasant 86 year old female, trigger thumb, injected at the last visit, she has no pain, only a little bit of triggering, continue home conditioning, return as needed. ?

## 2021-10-10 NOTE — Progress Notes (Signed)
? ? ?  Procedures performed today:   ? ?None. ? ?Independent interpretation of notes and tests performed by another provider:  ? ?None. ? ?Brief History, Exam, Impression, and Recommendations:   ? ?Trigger thumb of right hand ?Pleasant 86 year old female, trigger thumb, injected at the last visit, she has no pain, only a little bit of triggering, continue home conditioning, return as needed. ? ? ? ?___________________________________________ ?Ihor Austin. Benjamin Stain, M.D., ABFM., CAQSM. ?Primary Care and Sports Medicine ?[facility] ? ?Adjunct Instructor of Family Medicine  ?[employer] ?

## 2021-11-01 LAB — HM MAMMOGRAPHY

## 2021-11-03 ENCOUNTER — Encounter: Payer: Self-pay | Admitting: Family Medicine

## 2021-11-03 LAB — HM MAMMOGRAPHY

## 2021-11-16 ENCOUNTER — Encounter: Payer: Self-pay | Admitting: Family Medicine

## 2021-11-16 ENCOUNTER — Ambulatory Visit (INDEPENDENT_AMBULATORY_CARE_PROVIDER_SITE_OTHER): Payer: Medicare Other | Admitting: Family Medicine

## 2021-11-16 VITALS — BP 166/90 | HR 67 | Resp 16 | Ht 64.0 in | Wt 159.0 lb

## 2021-11-16 DIAGNOSIS — R030 Elevated blood-pressure reading, without diagnosis of hypertension: Secondary | ICD-10-CM

## 2021-11-16 DIAGNOSIS — I10 Essential (primary) hypertension: Secondary | ICD-10-CM

## 2021-11-16 DIAGNOSIS — N1832 Chronic kidney disease, stage 3b: Secondary | ICD-10-CM

## 2021-11-16 DIAGNOSIS — M65311 Trigger thumb, right thumb: Secondary | ICD-10-CM

## 2021-11-16 DIAGNOSIS — R7301 Impaired fasting glucose: Secondary | ICD-10-CM | POA: Diagnosis not present

## 2021-11-16 DIAGNOSIS — H6123 Impacted cerumen, bilateral: Secondary | ICD-10-CM

## 2021-11-16 DIAGNOSIS — H9313 Tinnitus, bilateral: Secondary | ICD-10-CM

## 2021-11-16 LAB — POCT GLYCOSYLATED HEMOGLOBIN (HGB A1C): Hemoglobin A1C: 5.5 % (ref 4.0–5.6)

## 2021-11-16 MED ORDER — LOSARTAN POTASSIUM 100 MG PO TABS: 100.0000 mg | ORAL_TABLET | Freq: Every day | ORAL | 3 refills | Status: DC

## 2021-11-16 NOTE — Assessment & Plan Note (Signed)
A1c looks phenomenal today at 5.5. ?

## 2021-11-16 NOTE — Progress Notes (Addendum)
? ?Established Patient Office Visit ? ?Subjective   ?Patient ID: Kimberly Durham, female    DOB: [birth year]  Age: 86 y.o. MRN: [id number] ? ?Chief Complaint  ?Patient presents with  ? Hypertension  ?  Follow up.   ? ? ?HPI ? ?Impaired fasting glucose-no increased thirst or urination. No symptoms consistent with hypoglycemia. ? ?Hypertension- Pt denies chest pain, SOB, dizziness, or heart palpitations.  Taking meds as directed w/o problems.  Denies medication side effects.  Brought in home log.  Blood pressures mostly in the 120s and 130s over 70s.  Pulse typically in the 50s to low 60s. ? ?Since ever since she came off of the Boniva she is actually had ear ringing bilaterally.  She would like me to check her ears.  She says it does seem to get worse at times. ? ?Osteoporosis  - the last time I saw her in Nov she was having sig inc in BP, nausea and pounding in her head after taking Boniva.  We discontinued it.  Did do an echo gram in January and everything honestly looked great.  He is taking calcium daily and vitamin D 3 times a week. ? ?She also let me know that switching from the lorazepam to the clonazepam has actually been a really good thing for her anxiety.  She says she has noticed she has had much fewer headaches since making the switch.  She only takes about a half a tab maybe once a week.  Says she does try to use it sparingly. ? ?She also wanted to ask about how often it was safe to use the meloxicam.  She is really only been using it occasionally when her right thumb becomes very tender or sore.  She says sometimes she will use it for couple days in a row and then usually stops. ? ?ROS ? ?  ?Objective:  ?  ? ?BP (!) 166/90 (BP Location: Right Arm)   Pulse 67   Resp 16   Ht 5\' 4"  (1.626 m)   Wt 159 lb (72.1 kg)   SpO2 99%   BMI 27.29 kg/m?  ? ? ?Physical Exam ?Vitals and nursing note reviewed.  ?Constitutional:   ?   Appearance: She is well-developed.  ?HENT:  ?   Head: Normocephalic and atraumatic.  ?    Comments: Bilat cerumen impaction ?Cardiovascular:  ?   Rate and Rhythm: Normal rate and regular rhythm.  ?   Heart sounds: Normal heart sounds.  ?Pulmonary:  ?   Effort: Pulmonary effort is normal.  ?   Breath sounds: Normal breath sounds.  ?Skin: ?   General: Skin is warm and dry.  ?Neurological:  ?   Mental Status: She is alert and oriented to person, place, and time.  ?Psychiatric:     ?   Behavior: Behavior normal.  ? ? ?Results for orders placed or performed in visit on 11/16/21  ?POCT HgB A1C  ?Result Value Ref Range  ? Hemoglobin A1C 5.5 4.0 - 5.6 %  ? HbA1c POC (<> result, manual entry)    ? HbA1c, POC (prediabetic range)    ? HbA1c, POC (controlled diabetic range)    ? ? ? ? ?The ASCVD Risk score (Arnett DK, et al., 2019) failed to calculate for the following reasons: ?  The 2019 ASCVD risk score is only valid for ages 31 to 74 ? ?  ?Assessment & Plan:  ? ?Problem List Items Addressed This Visit   ? ?  ?  Cardiovascular and Mediastinum  ? White coat syndrome with hypertension - Primary  ? Relevant Medications  ? losartan (COZAAR) 100 MG tablet  ? Hypertension  ?  Pressures look absolutely phenomenal.  It is always a little high when she comes in here.  We will continue to monitor great work.  She is actually starting to walk again with her husband.  She like to get about 5 or so pounds off. ? ?  ?  ? Relevant Medications  ? losartan (COZAAR) 100 MG tablet  ?  ? Endocrine  ? IFG (impaired fasting glucose)  ?  A1c looks phenomenal today at 5.5. ? ?  ?  ? Relevant Orders  ? POCT HgB A1C (Completed)  ?  ? Musculoskeletal and Integument  ? Trigger thumb of right hand  ?  Okay to use the meloxicam as needed just discussed with that we want to minimize daily use because it is very hard on the kidneys.  But if she needs to use it for a few days to reduce inflammation and then take a break from it for a couple of weeks at a time that would be perfect.  We will continue to monitor renal function routinely.  We  usually follow every 6 months. ? ?  ?  ?  ? Genitourinary  ? Chronic kidney disease (CKD) stage G3b/A1, moderately decreased glomerular filtration rate (GFR) between 30-44 mL/min/1.73 square meter and albuminuria creatinine ratio less than 30 mg/g (HCC)  ?  Due to recheck renal function.  Recheck every 6 months. ? ?  ?  ? Relevant Medications  ? losartan (COZAAR) 100 MG tablet  ? Other Relevant Orders  ? BASIC METABOLIC PANEL WITH GFR  ? ?Other Visit Diagnoses   ? ? White coat syndrome with high blood pressure without hypertension      ? Relevant Medications  ? losartan (COZAAR) 100 MG tablet  ? Other Relevant Orders  ? BASIC METABOLIC PANEL WITH GFR  ? Bilateral impacted cerumen      ? Relevant Orders  ? Ambulatory referral to ENT  ? Tinnitus of both ears      ? ?  ? ?Bilateral cerumen impaction-we discussed options for irrigation here versus referral to ENT.  She would like referral to ENT.  This could be contributing somewhat to the tinnitus, but it may not. ? ? ?Return in about 6 months (around 05/18/2022) for Hypertension.  ? ? ?Beatrice Lecher, MD ? ?

## 2021-11-16 NOTE — Assessment & Plan Note (Signed)
Okay to use the meloxicam as needed just discussed with that we want to minimize daily use because it is very hard on the kidneys.  But if she needs to use it for a few days to reduce inflammation and then take a break from it for a couple of weeks at a time that would be perfect.  We will continue to monitor renal function routinely.  We usually follow every 6 months. ?

## 2021-11-16 NOTE — Assessment & Plan Note (Addendum)
Due to recheck renal function.  Recheck every 6 months. ?

## 2021-11-16 NOTE — Assessment & Plan Note (Signed)
Pressures look absolutely phenomenal.  It is always a little high when she comes in here.  We will continue to monitor great work.  She is actually starting to walk again with her husband.  She like to get about 5 or so pounds off. ?

## 2021-11-17 LAB — BASIC METABOLIC PANEL WITH GFR
BUN/Creatinine Ratio: 26 (calc) — ABNORMAL HIGH (ref 6–22)
BUN: 30 mg/dL — ABNORMAL HIGH (ref 7–25)
CO2: 30 mmol/L (ref 20–32)
Calcium: 10.1 mg/dL (ref 8.6–10.4)
Chloride: 102 mmol/L (ref 98–110)
Creat: 1.15 mg/dL — ABNORMAL HIGH (ref 0.60–0.95)
Glucose, Bld: 100 mg/dL — ABNORMAL HIGH (ref 65–99)
Potassium: 5.5 mmol/L — ABNORMAL HIGH (ref 3.5–5.3)
Sodium: 139 mmol/L (ref 135–146)
eGFR: 47 mL/min/{1.73_m2} — ABNORMAL LOW (ref >=60)

## 2021-11-17 NOTE — Progress Notes (Signed)
Hi Kimberly Durham, kidney function is up just slightly at 1.1.  Baseline is usually around 1.9-1.0.  Nothing worrisome but we will keep an eye on it she just looks a little bit more dry on her blood work.  Sure hydrating well.

## 2021-12-01 ENCOUNTER — Encounter: Payer: Self-pay | Admitting: Family Medicine

## 2021-12-26 ENCOUNTER — Telehealth: Payer: Self-pay

## 2021-12-26 NOTE — Telephone Encounter (Signed)
Okay to take Until I see her but she needs an appointment.  Swelling is not a typical side effect of losartan.  It can be seen with amlodipine but she is not on that medication.  It can come from meloxicam dose if she has been using that heavily then she may want to hold off on using that as well.

## 2021-12-26 NOTE — Telephone Encounter (Signed)
Pt states that her losartan was increased from 50mg  to 100mg  in Oct, 2022.  Since that time she has been experiencing systolic BPs as low as 107 and feeling "starry eyed".  Recently her ankles have been swelling bilaterally extending approximately 5-6 inches up the leg and she has had some episodes of irregular HR.  Pt is questioning if this could be related to her increase in losartan. She states that she spoke with her pharmacist this morning and was advised that her symptoms could be coming from the increase in losartan so she only took 1/2 tab this morning and has not been experiencing these symptoms.  Please advise.  Pt would like to be called back this afternoon as she will be out of the house until then.  , CMA

## 2021-12-27 NOTE — Telephone Encounter (Signed)
Pt informed.  Pt expressed understanding and is agreeable.  Pt schedule for appt on 01/24/2022 at 10:30am.  Charyl Bigger, CMA

## 2022-01-24 ENCOUNTER — Ambulatory Visit: Payer: Medicare Other | Admitting: Family Medicine

## 2022-02-20 ENCOUNTER — Other Ambulatory Visit: Payer: Self-pay | Admitting: Family Medicine

## 2022-02-20 NOTE — Telephone Encounter (Signed)
Last written 07/26/2021 #45 no refills Last appt 11/16/2021

## 2022-03-13 ENCOUNTER — Telehealth: Payer: Self-pay | Admitting: *Deleted

## 2022-03-13 NOTE — Patient Outreach (Signed)
  Care Coordination   03/13/2022 Name: Kimberly Durham MRN: [id number] DOB: [birth year]   Care Coordination Outreach Attempts:  Contact was made with the patient today to offer care coordination services as a benefit of their health plan. Patient declined services. Follow Up Plan:  No further outreach attempts will be made at this time.  Encounter Outcome:  Pt. Visit Completed  Care Coordination Interventions Activated:  No   Care Coordination Interventions:  No, not indicated    Blanchie Serve RN, BSN [facility] Care Management [facility] [phone] [email or web address]

## 2022-03-14 ENCOUNTER — Encounter: Payer: Self-pay | Admitting: General Practice

## 2022-05-22 ENCOUNTER — Ambulatory Visit: Payer: Medicare Other | Admitting: Family Medicine

## 2022-05-23 ENCOUNTER — Encounter: Payer: Self-pay | Admitting: Family Medicine

## 2022-05-23 ENCOUNTER — Ambulatory Visit (INDEPENDENT_AMBULATORY_CARE_PROVIDER_SITE_OTHER): Payer: Medicare Other | Admitting: Family Medicine

## 2022-05-23 VITALS — BP 139/76 | HR 72 | Ht 64.0 in | Wt 155.1 lb

## 2022-05-23 DIAGNOSIS — R7301 Impaired fasting glucose: Secondary | ICD-10-CM

## 2022-05-23 DIAGNOSIS — I10 Essential (primary) hypertension: Secondary | ICD-10-CM

## 2022-05-23 DIAGNOSIS — Z23 Encounter for immunization: Secondary | ICD-10-CM | POA: Diagnosis not present

## 2022-05-23 DIAGNOSIS — I700 Atherosclerosis of aorta: Secondary | ICD-10-CM

## 2022-05-23 DIAGNOSIS — T466X5A Adverse effect of antihyperlipidemic and antiarteriosclerotic drugs, initial encounter: Secondary | ICD-10-CM

## 2022-05-23 DIAGNOSIS — G8929 Other chronic pain: Secondary | ICD-10-CM

## 2022-05-23 DIAGNOSIS — N1832 Chronic kidney disease, stage 3b: Secondary | ICD-10-CM

## 2022-05-23 DIAGNOSIS — M791 Myalgia, unspecified site: Secondary | ICD-10-CM

## 2022-05-23 DIAGNOSIS — M545 Low back pain, unspecified: Secondary | ICD-10-CM | POA: Insufficient documentation

## 2022-05-23 LAB — POCT GLYCOSYLATED HEMOGLOBIN (HGB A1C): Hemoglobin A1C: 5.6 % (ref 4.0–5.6)

## 2022-05-23 MED ORDER — MELOXICAM 15 MG PO TABS: 15.0000 mg | ORAL_TABLET | Freq: Every day | ORAL | 3 refills | Status: DC | PRN

## 2022-05-23 NOTE — Assessment & Plan Note (Signed)
A1c looks fantastic today at 5.6.  Continue current regimen.

## 2022-05-23 NOTE — Patient Instructions (Addendum)
Please schedule your medicare wellness visit on your way out. Please bring in your blood pressure cuff next office visit.

## 2022-05-23 NOTE — Assessment & Plan Note (Signed)
Due to recheck renal function.  She just uses the meloxicam maybe once a month.  Sinew to use NSAIDs sparingly.

## 2022-05-23 NOTE — Progress Notes (Signed)
Established Patient Office Visit  Subjective   Patient ID: Kimberly Durham, female    DOB: [birth year]  Age: 86 y.o. MRN: [id number]  Chief Complaint  Patient presents with   Hypertension    Pt reports clonazepam as need    HPI   Hypertension- Pt denies chest pain, SOB, dizziness, or heart palpitations.  Taking meds as directed w/o problems.  Denies medication side effects.  Brought in home blood pressure log today.  Home blood pressures mostly running in the 120s.  Lower pressures closer to 107.  Highest blood pressure was 147.  Is mostly in the 74s and 60s.  Impaired fasting glucose-no increased thirst or urination. No symptoms consistent with hypoglycemia.  She is exercising some but not consistently.  She has been under little bit more stress recently with some things going on with her husband who is 79.  Also reports a history of chronic intermittent low back pain.  She has had arthritis in her back for years.  When she was given a prescription for meloxicam to take for her right thumb she noticed that it provided significant relief for her back.  She wants to know if it would be okay to take meloxicam maybe once or twice a month.  It as it does provide significant relief for her.    ROS    Objective:     BP (!) 176/69   Pulse 72   Ht 5\' 4"  (1.626 m)   Wt 155 lb 1.9 oz (70.4 kg)   BMI 26.63 kg/m    Physical Exam Vitals and nursing note reviewed.  Constitutional:      Appearance: She is well-developed.  HENT:     Head: Normocephalic and atraumatic.  Cardiovascular:     Rate and Rhythm: Normal rate and regular rhythm.     Heart sounds: Normal heart sounds.  Pulmonary:     Effort: Pulmonary effort is normal.     Breath sounds: Normal breath sounds.  Skin:    General: Skin is warm and dry.  Neurological:     Mental Status: She is alert and oriented to person, place, and time.  Psychiatric:        Behavior: Behavior normal.      Results for orders placed or  performed in visit on 05/23/22  POCT glycosylated hemoglobin (Hb A1C)  Result Value Ref Range   Hemoglobin A1C 5.6 4.0 - 5.6 %   HbA1c POC (<> result, manual entry)     HbA1c, POC (prediabetic range)     HbA1c, POC (controlled diabetic range)        The ASCVD Risk score (Arnett DK, et al., 2019) failed to calculate for the following reasons:   The 2019 ASCVD risk score is only valid for ages 15 to 89    Assessment & Plan:   Problem List Items Addressed This Visit       Cardiovascular and Mediastinum   Hypertension    Home blood pressures are at goal.  We did discuss bringing in her home blood pressure cuff next time just to confirm.  She has had this current machine for about a year.      Relevant Orders   COMPLETE METABOLIC PANEL WITH GFR   Lipid Panel w/reflex Direct LDL   Aortic atherosclerosis (HCC)     Endocrine   IFG (impaired fasting glucose) - Primary    A1c looks fantastic today at 5.6.  Continue current regimen.      Relevant Orders  POCT glycosylated hemoglobin (Hb A1C) (Completed)   COMPLETE METABOLIC PANEL WITH GFR   Lipid Panel w/reflex Direct LDL     Genitourinary   Chronic kidney disease (CKD) stage G3b/A1, moderately decreased glomerular filtration rate (GFR) between 30-44 mL/min/1.73 square meter and albuminuria creatinine ratio less than 30 mg/g (HCC)    Due to recheck renal function.  She just uses the meloxicam maybe once a month.  Sinew to use NSAIDs sparingly.      Relevant Orders   COMPLETE METABOLIC PANEL WITH GFR   Lipid Panel w/reflex Direct LDL   Urine Microalbumin w/creat. ratio     Other   Chronic low back pain    Okay to use meloxicam sparingly.      Relevant Medications   meloxicam (MOBIC) 15 MG tablet   Other Visit Diagnoses     Myalgia due to statin       Need for immunization against influenza       Relevant Orders   Flu Vaccine QUAD High Dose(Fluad) (Completed)       Return in about 6 months (around 11/21/2022) for  Hypertension, Pre-diabetes.    Beatrice Lecher, MD

## 2022-05-23 NOTE — Assessment & Plan Note (Signed)
Okay to use meloxicam sparingly.

## 2022-05-23 NOTE — Assessment & Plan Note (Signed)
Home blood pressures are at goal.  We did discuss bringing in her home blood pressure cuff next time just to confirm.  She has had this current machine for about a year.

## 2022-05-24 LAB — COMPLETE METABOLIC PANEL WITH GFR
AG Ratio: 1.4 (calc) (ref 1.0–2.5)
ALT: 16 U/L (ref 6–29)
AST: 18 U/L (ref 10–35)
Albumin: 4.3 g/dL (ref 3.6–5.1)
Alkaline phosphatase (APISO): 65 U/L (ref 37–153)
BUN/Creatinine Ratio: 28 (calc) — ABNORMAL HIGH (ref 6–22)
BUN: 28 mg/dL — ABNORMAL HIGH (ref 7–25)
CO2: 25 mmol/L (ref 20–32)
Calcium: 9.6 mg/dL (ref 8.6–10.4)
Chloride: 103 mmol/L (ref 98–110)
Creat: 1.01 mg/dL — ABNORMAL HIGH (ref 0.60–0.95)
Globulin: 3 g/dL (calc) (ref 1.9–3.7)
Glucose, Bld: 100 mg/dL — ABNORMAL HIGH (ref 65–99)
Potassium: 4.5 mmol/L (ref 3.5–5.3)
Sodium: 138 mmol/L (ref 135–146)
Total Bilirubin: 0.7 mg/dL (ref 0.2–1.2)
Total Protein: 7.3 g/dL (ref 6.1–8.1)
eGFR: 55 mL/min/{1.73_m2} — ABNORMAL LOW (ref >=60)

## 2022-05-24 LAB — MICROALBUMIN / CREATININE URINE RATIO
Creatinine, Urine: 34 mg/dL (ref 20–275)
Microalb Creat Ratio: 29 mcg/mg creat (ref <30)
Microalb, Ur: 1 mg/dL

## 2022-05-24 LAB — LIPID PANEL W/REFLEX DIRECT LDL
Cholesterol: 274 mg/dL — ABNORMAL HIGH (ref <200)
HDL: 106 mg/dL (ref >=50)
LDL Cholesterol (Calc): 137 mg/dL (calc) — ABNORMAL HIGH
Non-HDL Cholesterol (Calc): 168 mg/dL (calc) — ABNORMAL HIGH (ref <130)
Total CHOL/HDL Ratio: 2.6 (calc) (ref <5.0)
Triglycerides: 173 mg/dL — ABNORMAL HIGH (ref <150)

## 2022-05-24 NOTE — Progress Notes (Signed)
Hi Kimberly Durham, kidney function looks better at 1.0.  This is closer to your baseline compared to 6 months ago which is good.  Potassium level is also normal.  Your cholesterol looks a little better as well.  LDL is down a little compared to last time.  No excess protein in the urine.

## 2022-07-10 ENCOUNTER — Encounter: Payer: Self-pay | Admitting: Family Medicine

## 2022-08-13 ENCOUNTER — Encounter: Payer: Self-pay | Admitting: Family Medicine

## 2022-09-11 ENCOUNTER — Telehealth: Payer: Self-pay | Admitting: Family Medicine

## 2022-09-11 NOTE — Telephone Encounter (Signed)
Called patient to schedule Medicare Annual Wellness Visit (AWV). Left message for patient to call back and schedule Medicare Annual Wellness Visit (AWV).  Last date of AWV: 10/05/19  Please schedule an appointment at any time with Nurse Health Advisor.  If any questions, please contact me at [phone].  Thank you ,  Lin Givens Patient Access Advocate II Direct Dial: [phone]

## 2022-09-17 ENCOUNTER — Other Ambulatory Visit: Payer: Self-pay | Admitting: *Deleted

## 2022-09-17 MED ORDER — CLONAZEPAM 0.5 MG PO TABS: ORAL_TABLET | ORAL | 0 refills | Status: DC

## 2022-11-21 ENCOUNTER — Encounter: Payer: Self-pay | Admitting: Family Medicine

## 2022-11-21 ENCOUNTER — Ambulatory Visit (INDEPENDENT_AMBULATORY_CARE_PROVIDER_SITE_OTHER): Payer: Medicare Other | Admitting: Family Medicine

## 2022-11-21 VITALS — BP 177/70 | HR 59 | Ht 64.0 in | Wt 153.0 lb

## 2022-11-21 DIAGNOSIS — I10 Essential (primary) hypertension: Secondary | ICD-10-CM

## 2022-11-21 DIAGNOSIS — Z23 Encounter for immunization: Secondary | ICD-10-CM | POA: Diagnosis not present

## 2022-11-21 DIAGNOSIS — I700 Atherosclerosis of aorta: Secondary | ICD-10-CM

## 2022-11-21 DIAGNOSIS — R7301 Impaired fasting glucose: Secondary | ICD-10-CM

## 2022-11-21 DIAGNOSIS — M25561 Pain in right knee: Secondary | ICD-10-CM

## 2022-11-21 DIAGNOSIS — N1832 Chronic kidney disease, stage 3b: Secondary | ICD-10-CM

## 2022-11-21 DIAGNOSIS — G720 Drug-induced myopathy: Secondary | ICD-10-CM

## 2022-11-21 DIAGNOSIS — T466X5A Adverse effect of antihyperlipidemic and antiarteriosclerotic drugs, initial encounter: Secondary | ICD-10-CM

## 2022-11-21 LAB — POCT GLYCOSYLATED HEMOGLOBIN (HGB A1C): Hemoglobin A1C: 5.7 % — AB (ref 4.0–5.6)

## 2022-11-21 LAB — HM MAMMOGRAPHY

## 2022-11-21 MED ORDER — LOSARTAN POTASSIUM 50 MG PO TABS: 50.0000 mg | ORAL_TABLET | Freq: Every day | ORAL | 3 refills | Status: DC

## 2022-11-21 NOTE — Assessment & Plan Note (Signed)
To recheck renal function. 

## 2022-11-21 NOTE — Assessment & Plan Note (Addendum)
Well controlled at home.  We did verify her cuff here. .will change tab to 50mg  so not splitting the 100mg  tab.   Continue current regimen. Follow up in  6 mo

## 2022-11-21 NOTE — Addendum Note (Signed)
Addended by: [name] D on: 11/21/2022 12:50 PM   Modules accepted: Orders

## 2022-11-21 NOTE — Assessment & Plan Note (Signed)
History of statin myopathy.

## 2022-11-21 NOTE — Assessment & Plan Note (Signed)
Lab Results  Component Value Date   HGBA1C 5.7 (A) 11/21/2022   A1c looks great today at 5.7.  She says she did get into a little bit more sweets over her recent vacation but otherwise has been doing well.

## 2022-11-21 NOTE — Progress Notes (Addendum)
Established Patient Office Visit  Subjective   Patient ID: Kimberly Durham, female    DOB: 10/10/1935  Age: 87 y.o. MRN: [id number]  Chief Complaint  Patient presents with   Hypertension   ifg    HPI   Hypertension- Pt denies chest pain, SOB, dizziness, or heart palpitations.  Taking meds as directed w/o problems.  Denies medication side effects.  Brought in home BP log. Only taking half the losartan. BPs mostly in 120s and130s.    Impaired fasting glucose-no increased thirst or urination. No symptoms consistent with hypoglycemia.  Aortic atherosclerosis -not currently on statin she gets myalgias with statins.  She also has some intermittent right knee pain.  She says it just comes and goes she never notices any swelling.  She thinks it gets triggered when she is going up and down her stairs in her kitchen because she tends to turn her foot outward because it does not actually have a radial so she does not more for balance and safety.  She says usually she will just massage the area or take her meloxicam every now and again and that actually gives her relief.  She says she tries not to take the meloxicam 2 days in a row.  ROS    Objective:     BP (!) 177/70   Pulse (!) 59   Ht 5\' 4"  (1.626 m)   Wt 153 lb (69.4 kg)   SpO2 99%   BMI 26.26 kg/m     Physical Exam Vitals and nursing note reviewed.  Constitutional:      Appearance: She is well-developed.  HENT:     Head: Normocephalic and atraumatic.  Cardiovascular:     Rate and Rhythm: Normal rate and regular rhythm.     Heart sounds: Normal heart sounds.  Pulmonary:     Effort: Pulmonary effort is normal.     Breath sounds: Normal breath sounds.  Skin:    General: Skin is warm and dry.  Neurological:     Mental Status: She is alert and oriented to person, place, and time.  Psychiatric:        Behavior: Behavior normal.     Results for orders placed or performed in visit on 11/21/22  POCT glycosylated hemoglobin  (Hb A1C)  Result Value Ref Range   Hemoglobin A1C 5.7 (A) 4.0 - 5.6 %   HbA1c POC (<> result, manual entry)     HbA1c, POC (prediabetic range)     HbA1c, POC (controlled diabetic range)         The ASCVD Risk score (Arnett DK, et al., 2019) failed to calculate for the following reasons:   The 2019 ASCVD risk score is only valid for ages 64 to 70    Assessment & Plan:   Problem List Items Addressed This Visit       Cardiovascular and Mediastinum   Hypertension - Primary    Well controlled at home.  We did verify her cuff here. .will change tab to 50mg  so not splitting the 100mg  tab.   Continue current regimen. Follow up in  6 mo       Relevant Medications   losartan (COZAAR) 50 MG tablet   Other Relevant Orders   COMPLETE METABOLIC PANEL WITH GFR   Aortic atherosclerosis (HCC)    History of statin myopathy.      Relevant Medications   losartan (COZAAR) 50 MG tablet   Other Relevant Orders   COMPLETE METABOLIC PANEL WITH GFR  Endocrine   IFG (impaired fasting glucose)    Lab Results  Component Value Date   HGBA1C 5.7 (A) 11/21/2022  A1c looks great today at 5.7.  She says she did get into a little bit more sweets over her recent vacation but otherwise has been doing well.      Relevant Orders   POCT glycosylated hemoglobin (Hb A1C) (Completed)   COMPLETE METABOLIC PANEL WITH GFR     Genitourinary   Chronic kidney disease (CKD) stage G3b/A1, moderately decreased glomerular filtration rate (GFR) between 30-44 mL/min/1.73 square meter and albuminuria creatinine ratio less than 30 mg/g (HCC)    To recheck renal function.      Relevant Orders   COMPLETE METABOLIC PANEL WITH GFR   Other Visit Diagnoses     Statin myopathy       Encounter for immunization       Relevant Orders   Pneumococcal conjugate vaccine 20-valent (Completed)   Acute pain of right knee          Knee pain, right -it is not actively bothering her today.  But we did discuss if it is  getting worse over time and she is not able to manage it then we will get her in with Ortho or sports med.  Return in about 6 months (around 05/24/2023) for Hypertension, Pre-diabetes.    [name], MD

## 2022-11-22 LAB — COMPLETE METABOLIC PANEL WITH GFR
AG Ratio: 1.3 (calc) (ref 1.0–2.5)
ALT: 15 U/L (ref 6–29)
AST: 19 U/L (ref 10–35)
Albumin: 4.4 g/dL (ref 3.6–5.1)
Alkaline phosphatase (APISO): 67 U/L (ref 37–153)
BUN/Creatinine Ratio: 27 (calc) — ABNORMAL HIGH (ref 6–22)
BUN: 26 mg/dL — ABNORMAL HIGH (ref 7–25)
CO2: 27 mmol/L (ref 20–32)
Calcium: 9.8 mg/dL (ref 8.6–10.4)
Chloride: 102 mmol/L (ref 98–110)
Creat: 0.95 mg/dL (ref 0.60–0.95)
Globulin: 3.3 g/dL (calc) (ref 1.9–3.7)
Glucose, Bld: 97 mg/dL (ref 65–99)
Potassium: 5.3 mmol/L (ref 3.5–5.3)
Sodium: 139 mmol/L (ref 135–146)
Total Bilirubin: 0.9 mg/dL (ref 0.2–1.2)
Total Protein: 7.7 g/dL (ref 6.1–8.1)
eGFR: 59 mL/min/{1.73_m2} — ABNORMAL LOW (ref >=60)

## 2022-11-22 NOTE — Progress Notes (Signed)
Your lab work is within acceptable range and there are no concerning findings.   ?

## 2022-11-30 ENCOUNTER — Encounter: Payer: Self-pay | Admitting: Family Medicine

## 2023-01-22 ENCOUNTER — Other Ambulatory Visit: Payer: Self-pay | Admitting: Family Medicine

## 2023-01-22 DIAGNOSIS — N1832 Chronic kidney disease, stage 3b: Secondary | ICD-10-CM

## 2023-01-22 DIAGNOSIS — R030 Elevated blood-pressure reading, without diagnosis of hypertension: Secondary | ICD-10-CM

## 2023-02-25 ENCOUNTER — Other Ambulatory Visit: Payer: Self-pay | Admitting: Family Medicine

## 2023-03-03 ENCOUNTER — Encounter: Payer: Self-pay | Admitting: Emergency Medicine

## 2023-03-03 ENCOUNTER — Other Ambulatory Visit: Payer: Self-pay

## 2023-03-03 ENCOUNTER — Ambulatory Visit
Admission: EM | Admit: 2023-03-03 | Discharge: 2023-03-03 | Disposition: A | Discharge status: Home / Self Care | Payer: Medicare Other | Attending: Family Medicine | Admitting: Family Medicine

## 2023-03-03 DIAGNOSIS — M25532 Pain in left wrist: Secondary | ICD-10-CM

## 2023-03-03 DIAGNOSIS — M65832 Other synovitis and tenosynovitis, left forearm: Secondary | ICD-10-CM

## 2023-03-03 MED ORDER — PREDNISONE 20 MG PO TABS: ORAL_TABLET | ORAL | 0 refills | Status: DC

## 2023-03-03 NOTE — ED Provider Notes (Signed)
[facility]    CSN: [id number] Arrival date & time: 03/03/23  1107      History   Chief Complaint Chief Complaint  Patient presents with   Tingling    HPI Kimberly Durham is a 87 y.o. female.   HPI Very pleasant 87 year old female presents with tingling and numbness of the left lower arm.  Patient reports that she has lower back and right knee problems so used her left lower arm/left wrist/left hand to push her up from sitting.  Patient reports that she may have irritated left lower arm from this maneuver. PMH significant for CKD stage III, dyslipidemia, and osteoporosis.  Past Medical History:  Diagnosis Date   Anxiety    Dyslipidemia    Hemorrhoids    Migraines    Osteoporosis    White coat hypertension     Patient Active Problem List   Diagnosis Date Noted   Chronic low back pain 05/23/2022   Hypertension 11/16/2021   Trigger thumb of right hand 08/15/2021   Microalbuminuria 05/07/2019   IFG (impaired fasting glucose) 05/07/2018   White coat syndrome with hypertension 06/01/2016   Aortic atherosclerosis (HCC) 11/15/2015   Chronic kidney disease (CKD) stage G3b/A1, moderately decreased glomerular filtration rate (GFR) between 30-44 mL/min/1.73 square meter and albuminuria creatinine ratio less than 30 mg/g (HCC) 05/18/2015   Migraine with aura 11/13/2012   FATIGUE 10/11/2009   HYPERKALEMIA 12/02/2007   Dyslipidemia 03/27/2007   GAD (generalized anxiety disorder) 05/17/2006   Osteoporosis 05/17/2006    Past Surgical History:  Procedure Laterality Date   ABDOMINAL HYSTERECTOMY     FLEXIBLE SIGMOIDOSCOPY  08/2002   negative   OOPHORECTOMY      OB History   No obstetric history on file.      Home Medications    Prior to Admission medications   Medication Sig Start Date End Date Taking? Authorizing Provider  predniSONE (DELTASONE) 20 MG tablet Take 2 tabs PO daily x 5 days. 03/03/23  Yes Trevor Iha, FNP  calcium carbonate (OS-CAL) 1250 MG  tablet Take 1 tablet by mouth 2 (two) times daily.    [provider]  clonazePAM (KLONOPIN) 0.5 MG tablet TAKE 1 TABLET(0.5 MG) BY MOUTH TWICE DAILY AS NEEDED FOR ANXIETY 02/25/23   Agapito Games, MD  fish oil-omega-3 fatty acids 1000 MG capsule Take 1 g by mouth 3 (three) times daily.    [provider]  losartan (COZAAR) 50 MG tablet Take 1 tablet (50 mg total) by mouth daily. 11/21/22   Agapito Games, MD  meloxicam (MOBIC) 15 MG tablet Take 1 tablet (15 mg total) by mouth daily as needed for pain. 05/23/22   Agapito Games, MD  Multiple Vitamin (MULTIVITAMIN) tablet Take 1 tablet by mouth daily.    [provider]  Vitamin D, Cholecalciferol, 400 UNITS TABS Take 1 tablet by mouth daily.    [provider]    Family History Family History  Problem Relation Age of Onset   Heart disease Mother    Diabetes Mother    COPD Father        smoker   Stroke Brother 29   Parkinsonism Brother     Social History Social History   Tobacco Use   Smoking status: Never   Smokeless tobacco: Never  Vaping Use   Vaping status: Never Used  Substance Use Topics   Alcohol use: Yes    Alcohol/week: 1.0 standard drink of alcohol    Types: 1 Glasses  of wine per week    Comment: occasionally   Drug use: No     Allergies   Clindamycin, Amoxicillin, Augmentin [amoxicillin-pot clavulanate], Boniva [ibandronic acid], Codeine, Epinephrine, Fosamax [alendronate sodium], Lisinopril, Prednisone, Statins, Sulfasalazine, and Sulfonamide derivatives   Review of Systems Review of Systems  Musculoskeletal:        Left lower arm tingling     Physical Exam Triage Vital Signs ED Triage Vitals [03/03/23 1123]  Encounter Vitals Group     BP (!) 167/79     Systolic BP Percentile      Diastolic BP Percentile      Pulse Rate 82     Resp 17     Temp 97.7 F (36.5 C)     Temp Source Oral     SpO2 98 %     Weight      Height      Head Circumference       Peak Flow      Pain Score 0     Pain Loc      Pain Education      Exclude from Growth Chart    No data found.  Updated Vital Signs BP (!) 167/79 (BP Location: Left Arm)   Pulse 82   Temp 97.7 F (36.5 C) (Oral)   Resp 17   SpO2 98%    Physical Exam Vitals and nursing note reviewed.  Constitutional:      General: She is not in acute distress.    Appearance: Normal appearance. She is normal weight. She is not ill-appearing.  HENT:     Head: Normocephalic and atraumatic.     Mouth/Throat:     Mouth: Mucous membranes are moist.     Pharynx: Oropharynx is clear.  Eyes:     Extraocular Movements: Extraocular movements intact.     Conjunctiva/sclera: Conjunctivae normal.     Pupils: Pupils are equal, round, and reactive to light.  Cardiovascular:     Rate and Rhythm: Normal rate and regular rhythm.     Pulses: Normal pulses.     Heart sounds: Normal heart sounds.  Pulmonary:     Effort: Pulmonary effort is normal.     Breath sounds: Normal breath sounds. No wheezing, rhonchi or rales.  Musculoskeletal:        General: Normal range of motion.     Cervical back: Normal range of motion and neck supple.     Comments: Left wrist (dorsum): Mildly TTP over distal radius/ulnar, no deformity noted; positive Finkelstein's, positive ulnar deviation, positive Tinel's sign; grip 5/5, neurovascular/neurosensory intact, brisk cap refill  Skin:    General: Skin is warm and dry.  Neurological:     General: No focal deficit present.     Mental Status: She is alert and oriented to person, place, and time. Mental status is at baseline.  Psychiatric:        Mood and Affect: Mood normal.        Behavior: Behavior normal.        Thought Content: Thought content normal.      UC Treatments / Results  Labs (all labs ordered are listed, but only abnormal results are displayed) Labs Reviewed - No data to display  EKG   Radiology No results found.  Procedures Procedures (including  critical care time)  Medications Ordered in UC Medications - No data to display  Initial Impression / Assessment and Plan / UC Course  I have reviewed the triage vital signs and  the nursing notes.  Pertinent labs & imaging results that were available during my care of the patient were reviewed by me and considered in my medical decision making (see chart for details).     MDM: 1. Extensor tenosynovitis of left wrist-Rx'd prednisone 40 mg daily x 5 days; 2.  Left wrist pain-Rx'd prednisone 40 mg daily x 5 days. Advised patient to take medication as directed with food to completion.  Encouraged increase daily water intake to 64 ounces per day while taking this medication.  Advised if symptoms worsen and/or unresolved please follow-up with PCP or here for further evaluation.  Patient discharged home, hemodynamically stable. Final Clinical Impressions(s) / UC Diagnoses   Final diagnoses:  Left wrist pain  Extensor tenosynovitis of left wrist     Discharge Instructions      Advised patient to take medication as directed with food to completion.  Encouraged increase daily water intake to 64 ounces per day while taking this medication.  Advised if symptoms worsen and/or unresolved please follow-up with PCP or here for further evaluation.     ED Prescriptions     Medication Sig Dispense Auth. Provider   predniSONE (DELTASONE) 20 MG tablet Take 2 tabs PO daily x 5 days. 15 tablet Trevor Iha, FNP      PDMP not reviewed this encounter.   Trevor Iha, FNP 03/03/23 1238

## 2023-03-03 NOTE — Discharge Instructions (Addendum)
Advised patient to take medication as directed with food to completion.  Encouraged increase daily water intake to 64 ounces per day while taking this medication.  Advised if symptoms worsen and/or unresolved please follow-up with PCP or here for further evaluation. 

## 2023-03-03 NOTE — ED Triage Notes (Signed)
Pt reports that this is the second time that she had tingling/numbness to occur in left lower arm. Reports that she has back and right knee problems, so uses her left hand/arm to push herself up from sitting. Pt believes it is irritated from that. Pt able to move arm/hand normally.

## 2023-03-21 ENCOUNTER — Ambulatory Visit: Payer: Medicare Other | Admitting: Sports Medicine

## 2023-03-21 ENCOUNTER — Ambulatory Visit (INDEPENDENT_AMBULATORY_CARE_PROVIDER_SITE_OTHER): Payer: Medicare Other

## 2023-03-21 DIAGNOSIS — M1711 Unilateral primary osteoarthritis, right knee: Secondary | ICD-10-CM | POA: Diagnosis not present

## 2023-03-21 DIAGNOSIS — G8929 Other chronic pain: Secondary | ICD-10-CM | POA: Diagnosis not present

## 2023-03-21 DIAGNOSIS — Z0189 Encounter for other specified special examinations: Secondary | ICD-10-CM | POA: Diagnosis not present

## 2023-03-21 DIAGNOSIS — M545 Low back pain, unspecified: Secondary | ICD-10-CM | POA: Diagnosis not present

## 2023-03-21 MED ORDER — MELOXICAM 15 MG PO TABS
15.0000 mg | ORAL_TABLET | ORAL | 3 refills | Status: AC
Start: 2023-03-21 — End: 2023-03-22

## 2023-03-21 NOTE — Progress Notes (Signed)
    Procedures performed today:    None.  Independent interpretation of notes and tests performed by another provider:   None.  Brief History, Exam, Impression, and Recommendations:    Primary osteoarthritis of right knee Pleasant 87 year old female, increasing retropatellar knee pain, moderate gelling and pain going up and down stairs, moderate patellar crepitus on exam without effusion, ligaments are stable. We discussed the anatomy and pathophysiology of osteoarthritis, I would like x-rays, she has used meloxicam in the past, 1 pill tends to last for 3 to 4 days. She will continue this, adding home physical therapy, return to see me in 6 weeks, injection if not better.    ____________________________________________ Ihor Austin. Benjamin Stain, M.D., ABFM., CAQSM., AME. Primary Care and Sports Medicine [facility]  Adjunct Professor of Family Medicine  [place] [employer]  Restaurant manager, fast food

## 2023-03-21 NOTE — Assessment & Plan Note (Signed)
Pleasant 86 year old female, increasing retropatellar knee pain, moderate gelling and pain going up and down stairs, moderate patellar crepitus on exam without effusion, ligaments are stable. We discussed the anatomy and pathophysiology of osteoarthritis, I would like x-rays, she has used meloxicam in the past, 1 pill tends to last for 3 to 4 days. She will continue this, adding home physical therapy, return to see me in 6 weeks, injection if not better.

## 2023-05-02 ENCOUNTER — Encounter: Payer: Self-pay | Admitting: Sports Medicine

## 2023-05-02 ENCOUNTER — Ambulatory Visit (INDEPENDENT_AMBULATORY_CARE_PROVIDER_SITE_OTHER): Payer: Medicare Other | Admitting: Sports Medicine

## 2023-05-02 DIAGNOSIS — M1711 Unilateral primary osteoarthritis, right knee: Secondary | ICD-10-CM | POA: Diagnosis not present

## 2023-05-02 NOTE — Progress Notes (Signed)
    Procedures performed today:    None.  Independent interpretation of notes and tests performed by another provider:   None.  Brief History, Exam, Impression, and Recommendations:    Primary osteoarthritis of right knee Well-controlled with occasional meloxicam and home physical therapy, return as needed.    ____________________________________________ Ihor Austin. Benjamin Stain, M.D., ABFM., CAQSM., AME. Primary Care and Sports Medicine [facility]  Adjunct Professor of Family Medicine  [place] [employer] [facility] [employer]  Restaurant manager, fast food

## 2023-05-02 NOTE — Assessment & Plan Note (Signed)
Well-controlled with occasional meloxicam and home physical therapy, return as needed.

## 2023-05-27 ENCOUNTER — Ambulatory Visit (INDEPENDENT_AMBULATORY_CARE_PROVIDER_SITE_OTHER): Payer: Medicare Other | Admitting: Family Medicine

## 2023-05-27 ENCOUNTER — Encounter: Payer: Self-pay | Admitting: Family Medicine

## 2023-05-27 VITALS — BP 136/72 | HR 72 | Ht 64.0 in | Wt 153.0 lb

## 2023-05-27 DIAGNOSIS — F411 Generalized anxiety disorder: Secondary | ICD-10-CM

## 2023-05-27 DIAGNOSIS — R7301 Impaired fasting glucose: Secondary | ICD-10-CM

## 2023-05-27 DIAGNOSIS — N1832 Chronic kidney disease, stage 3b: Secondary | ICD-10-CM

## 2023-05-27 DIAGNOSIS — I10 Essential (primary) hypertension: Secondary | ICD-10-CM | POA: Diagnosis not present

## 2023-05-27 DIAGNOSIS — E785 Hyperlipidemia, unspecified: Secondary | ICD-10-CM

## 2023-05-27 DIAGNOSIS — Z23 Encounter for immunization: Secondary | ICD-10-CM | POA: Diagnosis not present

## 2023-05-27 LAB — POCT GLYCOSYLATED HEMOGLOBIN (HGB A1C): Hemoglobin A1C: 5.8 % — AB (ref 4.0–5.6)

## 2023-05-27 MED ORDER — CLONAZEPAM 0.25 MG PO TBDP: 0.2500 mg | ORAL_TABLET | Freq: Two times a day (BID) | ORAL | 0 refills | Status: DC | PRN

## 2023-05-27 NOTE — Assessment & Plan Note (Signed)
A1c looks great today at 5.8.  Continue to work on healthy diet and regular exercise.

## 2023-05-27 NOTE — Progress Notes (Signed)
Established Patient Office Visit  Subjective   Patient ID: Kimberly Durham, female    DOB: 22-Oct-1935  Age: 87 y.o. MRN: [id number]  Chief Complaint  Patient presents with   Hypertension   ifg    HPI  Hypertension- Pt denies chest pain, SOB, dizziness, or heart palpitations.  Taking meds as directed w/o problems.  Denies medication side effects.    Impaired fasting glucose-no increased thirst or urination. No symptoms consistent with hypoglycemia.     ROS    Objective:     BP 136/72   Pulse 72   Ht 5\' 4"  (1.626 m)   Wt 153 lb (69.4 kg)   SpO2 99%   BMI 26.26 kg/m    Physical Exam Vitals and nursing note reviewed.  Constitutional:      Appearance: Normal appearance.  HENT:     Head: Normocephalic and atraumatic.  Eyes:     Conjunctiva/sclera: Conjunctivae normal.  Cardiovascular:     Rate and Rhythm: Normal rate and regular rhythm.  Pulmonary:     Effort: Pulmonary effort is normal.     Breath sounds: Normal breath sounds.  Skin:    General: Skin is warm and dry.  Neurological:     Mental Status: She is alert.  Psychiatric:        Mood and Affect: Mood normal.      Results for orders placed or performed in visit on 05/27/23  POCT HgB A1C  Result Value Ref Range   Hemoglobin A1C 5.8 (A) 4.0 - 5.6 %   HbA1c POC (<> result, manual entry)     HbA1c, POC (prediabetic range)     HbA1c, POC (controlled diabetic range)        The ASCVD Risk score (Arnett DK, et al., 2019) failed to calculate for the following reasons:   The 2019 ASCVD risk score is only valid for ages 42 to 52    Assessment & Plan:   Problem List Items Addressed This Visit       Cardiovascular and Mediastinum   Hypertension    Brought in home blood pressure log.  There is pretty significant variability she has systolic pressure around 99 and 101 but then she also has some pressures in the low to mid 140s.  Then most of them are in the 120s and 130s.      Relevant Orders    CMP14+EGFR   Lipid panel   CBC     Endocrine   IFG (impaired fasting glucose) - Primary    A1c looks great today at 5.8.  Continue to work on healthy diet and regular exercise.      Relevant Orders   POCT HgB A1C (Completed)   CMP14+EGFR   Lipid panel   CBC     Genitourinary   Chronic kidney disease (CKD) stage G3b/A1, moderately decreased glomerular filtration rate (GFR) between 30-44 mL/min/1.73 square meter and albuminuria creatinine ratio less than 30 mg/g (HCC)   Relevant Orders   CMP14+EGFR   Lipid panel   CBC   Urine Microalbumin w/creat. ratio     Other   GAD (generalized anxiety disorder)   Relevant Medications   clonazePAM (KLONOPIN) 0.25 MG disintegrating tablet   Dyslipidemia   Relevant Orders   Lipid panel   Other Visit Diagnoses     Encounter for immunization       Relevant Orders   Flu Vaccine Trivalent High Dose (Fluad) (Completed)       Return in about 6  months (around 11/24/2023) for Hypertension, Pre-diabetes.    Kimberly Gasser, MD

## 2023-05-27 NOTE — Assessment & Plan Note (Signed)
Brought in home blood pressure log.  There is pretty significant variability she has systolic pressure around 99 and 101 but then she also has some pressures in the low to mid 140s.  Then most of them are in the 120s and 130s.

## 2023-05-28 ENCOUNTER — Encounter: Payer: Self-pay | Admitting: Family Medicine

## 2023-05-28 LAB — MICROALBUMIN / CREATININE URINE RATIO
Creatinine, Urine: 45 mg/dL
Microalb/Creat Ratio: 23 mg/g{creat} (ref 0–29)
Microalbumin, Urine: 10.4 ug/mL

## 2023-05-28 LAB — CMP14+EGFR
ALT: 15 [IU]/L (ref 0–32)
AST: 19 [IU]/L (ref 0–40)
Albumin: 4.2 g/dL (ref 3.7–4.7)
Alkaline Phosphatase: 81 [IU]/L (ref 44–121)
BUN/Creatinine Ratio: 31 — ABNORMAL HIGH (ref 12–28)
BUN: 34 mg/dL — ABNORMAL HIGH (ref 8–27)
Bilirubin Total: 0.6 mg/dL (ref 0.0–1.2)
CO2: 21 mmol/L (ref 20–29)
Calcium: 9.5 mg/dL (ref 8.7–10.3)
Chloride: 101 mmol/L (ref 96–106)
Creatinine, Ser: 1.1 mg/dL — ABNORMAL HIGH (ref 0.57–1.00)
Globulin, Total: 2.8 g/dL (ref 1.5–4.5)
Glucose: 104 mg/dL — ABNORMAL HIGH (ref 70–99)
Potassium: 4.8 mmol/L (ref 3.5–5.2)
Sodium: 139 mmol/L (ref 134–144)
Total Protein: 7 g/dL (ref 6.0–8.5)
eGFR: 49 mL/min/{1.73_m2} — ABNORMAL LOW (ref >59)

## 2023-05-28 LAB — CBC
Hematocrit: 39.7 % (ref 34.0–46.6)
Hemoglobin: 13 g/dL (ref 11.1–15.9)
MCH: 30.5 pg (ref 26.6–33.0)
MCHC: 32.7 g/dL (ref 31.5–35.7)
MCV: 93 fL (ref 79–97)
Platelets: 346 10*3/uL (ref 150–450)
RBC: 4.26 x10E6/uL (ref 3.77–5.28)
RDW: 12.7 % (ref 11.7–15.4)
WBC: 9.1 10*3/uL (ref 3.4–10.8)

## 2023-05-28 LAB — LIPID PANEL
Chol/HDL Ratio: 2.6 ratio (ref 0.0–4.4)
Cholesterol, Total: 269 mg/dL — ABNORMAL HIGH (ref 100–199)
HDL: 105 mg/dL (ref >39)
LDL Chol Calc (NIH): 138 mg/dL — ABNORMAL HIGH (ref 0–99)
Triglycerides: 151 mg/dL — ABNORMAL HIGH (ref 0–149)
VLDL Cholesterol Cal: 26 mg/dL (ref 5–40)

## 2023-05-30 NOTE — Progress Notes (Signed)
Hi Jeslyn, total cholesterol and LDL are elevated as well as your triglycerides.  Just continue to work on healthy diet.  Kidney function is stable.  Liver enzymes look good.  You still look a little dry on your blood work, so please try to increase your water intake.  Blood count is normal.  No excess protein in the urine which is great.

## 2023-06-12 ENCOUNTER — Encounter: Payer: Self-pay | Admitting: Family Medicine

## 2023-06-15 ENCOUNTER — Ambulatory Visit
Admission: EM | Admit: 2023-06-15 | Discharge: 2023-06-15 | Disposition: A | Discharge status: Home / Self Care | Payer: Medicare Other | Attending: Family Medicine | Admitting: Family Medicine

## 2023-06-15 ENCOUNTER — Encounter: Payer: Self-pay | Admitting: Emergency Medicine

## 2023-06-15 DIAGNOSIS — H6122 Impacted cerumen, left ear: Secondary | ICD-10-CM | POA: Diagnosis not present

## 2023-06-15 DIAGNOSIS — I10 Essential (primary) hypertension: Secondary | ICD-10-CM | POA: Diagnosis not present

## 2023-06-15 NOTE — Discharge Instructions (Signed)
Return as needed  Your blood pressure is elevated but I understand you have whitecoat hypertension.  Follow-up with your primary care doctor

## 2023-06-15 NOTE — ED Provider Notes (Signed)
[facility]    CSN: [id number] Arrival date & time: 06/15/23  1436      History   Chief Complaint Chief Complaint  Patient presents with   Otalgia    HPI Kimberly Durham is a 87 y.o. female.   Patient states she has had trouble in the past with earwax buildup.  She thinks she has earwax buildup in her left ear.  It is uncomfortable and full.  Her hearing is diminished.  She is otherwise feeling well  Past Medical History:  Diagnosis Date   Anxiety    Dyslipidemia    Hemorrhoids    Migraines    Osteoporosis    White coat hypertension     Patient Active Problem List   Diagnosis Date Noted   Primary osteoarthritis of right knee 03/21/2023   Chronic low back pain 05/23/2022   Hypertension 11/16/2021   Trigger thumb of right hand 08/15/2021   Microalbuminuria 05/07/2019   IFG (impaired fasting glucose) 05/07/2018   White coat syndrome with hypertension 06/01/2016   Aortic atherosclerosis (HCC) 11/15/2015   Chronic kidney disease (CKD) stage G3b/A1, moderately decreased glomerular filtration rate (GFR) between 30-44 mL/min/1.73 square meter and albuminuria creatinine ratio less than 30 mg/g (HCC) 05/18/2015   Migraine with aura 11/13/2012   FATIGUE 10/11/2009   HYPERKALEMIA 12/02/2007   Dyslipidemia 03/27/2007   GAD (generalized anxiety disorder) 05/17/2006   Osteoporosis 05/17/2006    Past Surgical History:  Procedure Laterality Date   ABDOMINAL HYSTERECTOMY     FLEXIBLE SIGMOIDOSCOPY  08/2002   negative   OOPHORECTOMY      OB History   No obstetric history on file.      Home Medications    Prior to Admission medications   Medication Sig Start Date End Date Taking? Authorizing Provider  calcium carbonate (OS-CAL) 1250 MG tablet Take 1 tablet by mouth 2 (two) times daily.   Yes [provider]  clonazePAM (KLONOPIN) 0.25 MG disintegrating tablet Take 1 tablet (0.25 mg total) by mouth 2 (two) times daily as needed for seizure. 05/27/23   Yes Agapito Games, MD  fish oil-omega-3 fatty acids 1000 MG capsule Take 1 g by mouth 3 (three) times daily.   Yes [provider]  losartan (COZAAR) 50 MG tablet Take 1 tablet (50 mg total) by mouth daily. 11/21/22  Yes Agapito Games, MD  Multiple Vitamin (MULTIVITAMIN) tablet Take 1 tablet by mouth daily.   Yes [provider]  Vitamin D, Cholecalciferol, 400 UNITS TABS Take 1 tablet by mouth daily.   Yes [provider]    Family History Family History  Problem Relation Age of Onset   Heart disease Mother    Diabetes Mother    COPD Father        smoker   Stroke Brother 80   Parkinsonism Brother     Social History Social History   Tobacco Use   Smoking status: Never   Smokeless tobacco: Never  Vaping Use   Vaping status: Never Used  Substance Use Topics   Alcohol use: Yes    Alcohol/week: 1.0 standard drink of alcohol    Types: 1 Glasses of wine per week    Comment: occasionally   Drug use: No     Allergies   Clindamycin, Amoxicillin, Augmentin [amoxicillin-pot clavulanate], Boniva [ibandronic acid], Codeine, Epinephrine, Fosamax [alendronate sodium], Lisinopril, Prednisone, Statins, Sulfasalazine, and Sulfonamide derivatives   Review of Systems Review of Systems See HPI  Physical Exam Triage Vital Signs  ED Triage Vitals  Encounter Vitals Group     BP 06/15/23 1451 (!) 202/88     Systolic BP Percentile --      Diastolic BP Percentile --      Pulse Rate 06/15/23 1451 72     Resp 06/15/23 1451 16     Temp 06/15/23 1451 97.9 F (36.6 C)     Temp Source 06/15/23 1451 Oral     SpO2 06/15/23 1451 98 %     Weight 06/15/23 1453 152 lb (68.9 kg)     Height 06/15/23 1453 5\' 3"  (1.6 m)     Head Circumference --      Peak Flow --      Pain Score 06/15/23 1453 0     Pain Loc --      Pain Education --      Exclude from Growth Chart --    No data found.  Updated Vital Signs BP (!) 202/88 (BP Location: Right Arm)   Pulse  72   Temp 97.9 F (36.6 C) (Oral)   Resp 16   Ht 5\' 3"  (1.6 m)   Wt 68.9 kg   SpO2 98%   BMI 26.93 kg/m      Physical Exam Constitutional:      General: She is not in acute distress.    Appearance: She is well-developed.  HENT:     Head: Normocephalic and atraumatic.     Ears:     Comments: Narrow ear canals.  Right is only partially blocks.  Left is completely blocked.  Irrigation with curettage was performed Eyes:     Conjunctiva/sclera: Conjunctivae normal.     Pupils: Pupils are equal, round, and reactive to light.  Cardiovascular:     Rate and Rhythm: Normal rate.  Pulmonary:     Effort: Pulmonary effort is normal. No respiratory distress.  Abdominal:     General: There is no distension.     Palpations: Abdomen is soft.  Musculoskeletal:        General: Normal range of motion.     Cervical back: Normal range of motion.  Skin:    General: Skin is warm and dry.  Neurological:     Mental Status: She is alert.      UC Treatments / Results  Labs (all labs ordered are listed, but only abnormal results are displayed) Labs Reviewed - No data to display  EKG   Radiology No results found.  Procedures Procedures (including critical care time)  Medications Ordered in UC Medications - No data to display  Initial Impression / Assessment and Plan / UC Course  I have reviewed the triage vital signs and the nursing notes.  Pertinent labs & imaging results that were available during my care of the patient were reviewed by me and considered in my medical decision making (see chart for details).    Irrigation TM is clear Final Clinical Impressions(s) / UC Diagnoses   Final diagnoses:  Impacted cerumen of left ear  White coat syndrome with hypertension     Discharge Instructions      Return as needed  Your blood pressure is elevated but I understand you have whitecoat hypertension.  Follow-up with your primary care doctor     ED Prescriptions    None    PDMP not reviewed this encounter.   Eustace Moore, MD 06/15/23 [phone]

## 2023-06-15 NOTE — ED Triage Notes (Signed)
Patient c/o left ear discomfort that started last night.  Patient unsure if it needs to be irrigated or not.  Denies any other sx's.  Denies any OTC meds.

## 2023-11-06 ENCOUNTER — Other Ambulatory Visit: Payer: Self-pay | Admitting: *Deleted

## 2023-11-06 DIAGNOSIS — I10 Essential (primary) hypertension: Secondary | ICD-10-CM

## 2023-11-06 MED ORDER — LOSARTAN POTASSIUM 50 MG PO TABS: 50.0000 mg | ORAL_TABLET | Freq: Every day | ORAL | 3 refills | Status: AC

## 2023-11-07 ENCOUNTER — Encounter: Payer: Self-pay | Admitting: Family Medicine

## 2023-11-07 ENCOUNTER — Other Ambulatory Visit: Payer: Self-pay | Admitting: Family Medicine

## 2023-11-07 DIAGNOSIS — F411 Generalized anxiety disorder: Secondary | ICD-10-CM

## 2023-11-07 MED ORDER — CLONAZEPAM 0.25 MG PO TBDP: 0.2500 mg | ORAL_TABLET | Freq: Two times a day (BID) | ORAL | 0 refills | Status: DC | PRN

## 2023-11-07 NOTE — Telephone Encounter (Signed)
 Meds ordered this encounter  Medications   clonazePAM (KLONOPIN) 0.25 MG disintegrating tablet    Sig: Take 1 tablet (0.25 mg total) by mouth 2 (two) times daily as needed.    Dispense:  60 tablet    Refill:  0

## 2023-11-07 NOTE — Telephone Encounter (Signed)
 Last OV: 05/27/23 Next OV: 11/26/23 Last RF: 05/27/23

## 2023-11-26 ENCOUNTER — Ambulatory Visit (INDEPENDENT_AMBULATORY_CARE_PROVIDER_SITE_OTHER): Payer: Medicare Other | Admitting: Family Medicine

## 2023-11-26 ENCOUNTER — Encounter: Payer: Self-pay | Admitting: Family Medicine

## 2023-11-26 VITALS — BP 128/72 | HR 71 | Ht 64.0 in | Wt 145.1 lb

## 2023-11-26 DIAGNOSIS — N1832 Chronic kidney disease, stage 3b: Secondary | ICD-10-CM

## 2023-11-26 DIAGNOSIS — F411 Generalized anxiety disorder: Secondary | ICD-10-CM

## 2023-11-26 DIAGNOSIS — M810 Age-related osteoporosis without current pathological fracture: Secondary | ICD-10-CM

## 2023-11-26 DIAGNOSIS — M791 Myalgia, unspecified site: Secondary | ICD-10-CM | POA: Insufficient documentation

## 2023-11-26 DIAGNOSIS — R7301 Impaired fasting glucose: Secondary | ICD-10-CM

## 2023-11-26 DIAGNOSIS — E785 Hyperlipidemia, unspecified: Secondary | ICD-10-CM

## 2023-11-26 DIAGNOSIS — I700 Atherosclerosis of aorta: Secondary | ICD-10-CM | POA: Diagnosis not present

## 2023-11-26 DIAGNOSIS — I10 Essential (primary) hypertension: Secondary | ICD-10-CM

## 2023-11-26 DIAGNOSIS — T466X5A Adverse effect of antihyperlipidemic and antiarteriosclerotic drugs, initial encounter: Secondary | ICD-10-CM

## 2023-11-26 LAB — POCT GLYCOSYLATED HEMOGLOBIN (HGB A1C): Hemoglobin A1C: 5.8 % — AB (ref 4.0–5.6)

## 2023-11-26 NOTE — Assessment & Plan Note (Signed)
 A1c of 5.8 today.  She is doing fantastic and stable.  Follow-up in 6 months.

## 2023-11-26 NOTE — Assessment & Plan Note (Signed)
 Home blood pressure log looks good we will recheck it again today before she leaves we will continue with losartan  50 mg.  Due for updated BMP today.

## 2023-11-26 NOTE — Progress Notes (Signed)
 Established Patient Office Visit  Subjective  Patient ID: Kimberly Durham, female    DOB: 10/19/1935  Age: 88 y.o. MRN: [id number]  Chief Complaint  Patient presents with   Hypertension   ifg    HPI  Hypertension- Pt denies chest pain, SOB, dizziness, or heart palpitations.  Taking meds as directed w/o problems.  Denies medication side effects.  He did bring in home blood pressure log.  Most of her blood pressures are in the 130s.  A few in the 140s but a few under 120.  Impaired fasting glucose-no increased thirst or urination. No symptoms consistent with hypoglycemia.  Doing well. Had her last eye exam last summer and says she feels her glasses just aren't right.  I encouraged her to follow back up with them it could be the prescription but it could also be how the lenses were cut.     ROS    Objective:     BP 128/72 (BP Location: Left Arm, Cuff Size: Normal)   Pulse 71   Ht 5\' 4"  (1.626 m)   Wt 145 lb 1.9 oz (65.8 kg)   SpO2 100%   BMI 24.91 kg/m    Physical Exam Vitals and nursing note reviewed.  Constitutional:      Appearance: Normal appearance.  HENT:     Head: Normocephalic and atraumatic.  Eyes:     Conjunctiva/sclera: Conjunctivae normal.  Cardiovascular:     Rate and Rhythm: Normal rate and regular rhythm.  Pulmonary:     Effort: Pulmonary effort is normal.     Breath sounds: Normal breath sounds.  Skin:    General: Skin is warm and dry.  Neurological:     Mental Status: She is alert.  Psychiatric:        Mood and Affect: Mood normal.     Results for orders placed or performed in visit on 11/26/23  POCT HgB A1C  Result Value Ref Range   Hemoglobin A1C 5.8 (A) 4.0 - 5.6 %   HbA1c POC (<> result, manual entry)     HbA1c, POC (prediabetic range)     HbA1c, POC (controlled diabetic range)        The ASCVD Risk score (Arnett DK, et al., 2019) failed to calculate for the following reasons:   The 2019 ASCVD risk score is only valid for ages 62  to 59    Assessment & Plan:   Problem List Items Addressed This Visit       Cardiovascular and Mediastinum   White coat syndrome with hypertension   Hypertension - Primary   Home blood pressure log looks good we will recheck it again today before she leaves we will continue with losartan  50 mg.  Due for updated BMP today.      Relevant Orders   CMP14+EGFR   Urine Microalbumin w/creat. ratio   Aortic atherosclerosis (HCC)   Continue to manage blood pressure she also takes fish oil.  Not currently on a statin.      Relevant Orders   CMP14+EGFR   Urine Microalbumin w/creat. ratio     Endocrine   IFG (impaired fasting glucose)   A1c of 5.8 today.  She is doing fantastic and stable.  Follow-up in 6 months.      Relevant Orders   CMP14+EGFR   Urine Microalbumin w/creat. ratio   POCT HgB A1C (Completed)     Musculoskeletal and Integument   Osteoporosis   You adequate calcium, vitamin D  and resistance training will  get updated bone density test.      Relevant Orders   DG Bone Density   VITAMIN D  25 Hydroxy (Vit-D Deficiency, Fractures)     Genitourinary   Chronic kidney disease (CKD) stage G3b/A1, moderately decreased glomerular filtration rate (GFR) between 30-44 mL/min/1.73 square meter and albuminuria creatinine ratio less than 30 mg/g (HCC)   Relevant Orders   CMP14+EGFR   Urine Microalbumin w/creat. ratio     Other   Myalgia due to statin   He has tried statins in the past for her atherosclerosis and did not tolerate them.      GAD (generalized anxiety disorder)   GAD- 7 score of 3.  Continue alprazolam as needed but just encouraged her to continue to use very sparingly.  We just refilled her medication a couple of weeks ago.      Dyslipidemia    Return in about 6 months (around 05/28/2024) for Hypertension, Pre-diabetes.    Duaine German, MD

## 2023-11-26 NOTE — Assessment & Plan Note (Signed)
 You adequate calcium, vitamin D  and resistance training will get updated bone density test.

## 2023-11-26 NOTE — Assessment & Plan Note (Signed)
 Continue to manage blood pressure she also takes fish oil.  Not currently on a statin.

## 2023-11-26 NOTE — Assessment & Plan Note (Signed)
 GAD- 7 score of 3.  Continue alprazolam as needed but just encouraged her to continue to use very sparingly.  We just refilled her medication a couple of weeks ago.

## 2023-11-26 NOTE — Assessment & Plan Note (Signed)
 He has tried statins in the past for her atherosclerosis and did not tolerate them.

## 2023-11-27 LAB — MICROALBUMIN / CREATININE URINE RATIO
Creatinine, Urine: 21.8 mg/dL
Microalb/Creat Ratio: 41 mg/g{creat} — ABNORMAL HIGH (ref 0–29)
Microalbumin, Urine: 8.9 ug/mL

## 2023-11-27 LAB — CMP14+EGFR
ALT: 19 IU/L (ref 0–32)
AST: 23 IU/L (ref 0–40)
Albumin: 4.6 g/dL (ref 3.7–4.7)
Alkaline Phosphatase: 82 IU/L (ref 44–121)
BUN/Creatinine Ratio: 24 (ref 12–28)
BUN: 23 mg/dL (ref 8–27)
Bilirubin Total: 0.7 mg/dL (ref 0.0–1.2)
CO2: 22 mmol/L (ref 20–29)
Calcium: 9.9 mg/dL (ref 8.7–10.3)
Chloride: 101 mmol/L (ref 96–106)
Creatinine, Ser: 0.95 mg/dL (ref 0.57–1.00)
Globulin, Total: 2.6 g/dL (ref 1.5–4.5)
Glucose: 99 mg/dL (ref 70–99)
Potassium: 4.9 mmol/L (ref 3.5–5.2)
Sodium: 140 mmol/L (ref 134–144)
Total Protein: 7.2 g/dL (ref 6.0–8.5)
eGFR: 58 mL/min/{1.73_m2} — ABNORMAL LOW (ref >59)

## 2023-11-27 LAB — HM MAMMOGRAPHY

## 2023-11-27 LAB — VITAMIN D 25 HYDROXY (VIT D DEFICIENCY, FRACTURES): Vit D, 25-Hydroxy: 37.8 ng/mL (ref 30.0–100.0)

## 2023-11-28 ENCOUNTER — Encounter: Payer: Self-pay | Admitting: Family Medicine

## 2023-11-28 NOTE — Progress Notes (Signed)
 HI Kimberly Durham, your kidney function looks better this time.  Liver enzymes are normal.  There is a little extra protein in the urine this time it happened before about 4 years ago where it just bumped up and then went back down so I do not keep an eye on that we will plan to recheck it again in 6 months.  Your vitamin D  is on the low end of normal it is kind of drifted down so just make sure that you are taking 25 mcg daily for your bone health.

## 2023-12-04 ENCOUNTER — Other Ambulatory Visit

## 2023-12-09 ENCOUNTER — Ambulatory Visit

## 2023-12-09 DIAGNOSIS — M810 Age-related osteoporosis without current pathological fracture: Secondary | ICD-10-CM | POA: Diagnosis not present

## 2023-12-09 DIAGNOSIS — Z13820 Encounter for screening for osteoporosis: Secondary | ICD-10-CM

## 2023-12-11 ENCOUNTER — Encounter: Payer: Self-pay | Admitting: Family Medicine

## 2023-12-12 ENCOUNTER — Ambulatory Visit: Payer: Self-pay | Admitting: Family Medicine

## 2023-12-12 DIAGNOSIS — M810 Age-related osteoporosis without current pathological fracture: Secondary | ICD-10-CM

## 2023-12-12 NOTE — Progress Notes (Signed)
 Hi Kimberly Durham, bone density test shows a T-score of -2.6 consistent with osteoporosis.  This is actually stable from 2021 which is fantastic.  I cannot remember if you took Fosamax  or Boniva  in the past and how long ago that was please let me know.

## 2023-12-13 MED ORDER — ALENDRONATE SODIUM 70 MG PO TABS: 70.0000 mg | ORAL_TABLET | ORAL | 4 refills | Status: AC

## 2023-12-13 NOTE — Telephone Encounter (Signed)
Meds ordered this encounter  Medications   alendronate (FOSAMAX) 70 MG tablet    Sig: Take 1 tablet (70 mg total) by mouth every 7 (seven) days. Take with a full glass of water on an empty stomach.    Dispense:  12 tablet    Refill:  4

## 2024-01-09 ENCOUNTER — Encounter: Payer: Self-pay | Admitting: Sports Medicine

## 2024-01-09 ENCOUNTER — Ambulatory Visit (INDEPENDENT_AMBULATORY_CARE_PROVIDER_SITE_OTHER): Admitting: Sports Medicine

## 2024-01-09 ENCOUNTER — Ambulatory Visit

## 2024-01-09 DIAGNOSIS — M170 Bilateral primary osteoarthritis of knee: Secondary | ICD-10-CM

## 2024-01-09 DIAGNOSIS — M1711 Unilateral primary osteoarthritis, right knee: Secondary | ICD-10-CM | POA: Diagnosis not present

## 2024-01-09 MED ORDER — DICLOFENAC SODIUM 1 % EX GEL
4.0000 g | Freq: Four times a day (QID) | CUTANEOUS | 11 refills | Status: AC
Start: 2024-01-09 — End: ?

## 2024-01-09 MED ORDER — HYALURONIC ACID 100 MG PO CAPS: 1.0000 | ORAL_CAPSULE | Freq: Two times a day (BID) | ORAL | Status: AC

## 2024-01-09 NOTE — Patient Instructions (Signed)
 Kimberly Durham

## 2024-01-09 NOTE — Progress Notes (Addendum)
    Procedures performed today:    None.  Independent interpretation of notes and tests performed by another provider:   None.  Brief History, Exam, Impression, and Recommendations:    Primary osteoarthritis of right knee Exquisitely pleasant 88 year old female with known right knee osteoarthritis, historically she has responded well to meloxicam . She does desire more of a nonpharmacologic approach. We talked about the treatment modalities for osteoarthritis as well as the natural history, we we will do topical Voltaren, she will do some compression sleeves, topical cryotherapy, home physical therapy. Add like updated x-rays. She will interested in supplements, I explained that supplements do not really have the prove that we would like however hyaluronic acid does have some randomized control trials demonstrating efficacy over placebo. Return to see me 6 weeks as needed.    ____________________________________________ Joselyn Nicely. Sandy Crumb, M.D., ABFM., CAQSM., AME. Primary Care and Sports Medicine [facility]  Adjunct Professor of [facility] Medicine  [employer]  Restaurant manager, fast food

## 2024-01-09 NOTE — Assessment & Plan Note (Addendum)
 Exquisitely pleasant 88 year old female with known right knee osteoarthritis, historically she has responded well to meloxicam . She does desire more of a nonpharmacologic approach. We talked about the treatment modalities for osteoarthritis as well as the natural history, we we will do topical Voltaren, she will do some compression sleeves, topical cryotherapy, home physical therapy. Add like updated x-rays. She will interested in supplements, I explained that supplements do not really have the prove that we would like however hyaluronic acid does have some randomized control trials demonstrating efficacy over placebo. Return to see me 6 weeks as needed.

## 2024-02-20 ENCOUNTER — Ambulatory Visit (INDEPENDENT_AMBULATORY_CARE_PROVIDER_SITE_OTHER): Admitting: Sports Medicine

## 2024-02-20 DIAGNOSIS — M1711 Unilateral primary osteoarthritis, right knee: Secondary | ICD-10-CM | POA: Diagnosis not present

## 2024-02-20 NOTE — Assessment & Plan Note (Signed)
 This is an exquisitely pleasant 88 year old female with right knee osteoarthritis, historically responded well to meloxicam , at the last visit she desired more of a nonpharmacologic approach, we respected this, we discussed the limited efficacy of supplements however hyaluronic acid did have some randomized control trials demonstrating efficacy in an oral formulation over placebo. Other supplements such as glucosamine and conjoint were no better than placebo. We added topical Voltaren , she is only needing to use that twice a day, her pain is under control, she will continue her home conditioning and return to see us  on an as needed basis. Of note she lives sitting on her porch and watching the cumulus clouds.

## 2024-02-20 NOTE — Progress Notes (Signed)
    Procedures performed today:    None.  Independent interpretation of notes and tests performed by another provider:   None.  Brief History, Exam, Impression, and Recommendations:    Primary osteoarthritis of right knee This is an exquisitely pleasant 88 year old female with right knee osteoarthritis, historically responded well to meloxicam , at the last visit she desired more of a nonpharmacologic approach, we respected this, we discussed the limited efficacy of supplements however hyaluronic acid did have some randomized control trials demonstrating efficacy in an oral formulation over placebo. Other supplements such as glucosamine and conjoint were no better than placebo. We added topical Voltaren , she is only needing to use that twice a day, her pain is under control, she will continue her home conditioning and return to see us  on an as needed basis. Of note she lives sitting on her porch and watching the cumulus clouds.    ____________________________________________ Debby PARAS. Curtis, M.D., ABFM., CAQSM., AME. Primary Care and Sports Medicine [facility]  Adjunct Professor of [facility] Medicine  [employer]  Restaurant manager, fast food

## 2024-03-04 ENCOUNTER — Telehealth: Payer: Self-pay | Admitting: Family Medicine

## 2024-03-04 MED ORDER — CLONAZEPAM 0.5 MG PO TABS: 0.2500 mg | ORAL_TABLET | Freq: Every day | ORAL | 0 refills | Status: DC | PRN

## 2024-03-04 NOTE — Telephone Encounter (Signed)
 Copied from CRM [id number]. Topic: Clinical - Medication Refill >> Mar 04, 2024 10:38 AM Carrielelia G wrote: Medication: clonazePAM  (KLONOPIN ) 0.25 MG disintegrating tablet    (Disolving tabs are currently on back order, can a new script for the regular tabs be sent per Hailey at pharmacy)   Has the patient contacted their pharmacy? Yes (Agent: If no, request that the patient contact the pharmacy for the refill. If patient does not wish to contact the pharmacy document the reason why and proceed with request.) (Agent: If yes, when and what did the pharmacy advise?)  This is the patient's preferred pharmacy:  [id number] - [place], [place] - [address] AT [facility] [address]  [place] [address] Phone: [phone] Fax: [phone]  [facility] [place], [place] - [address] [place] [address] [place] [address] Phone: [phone] Fax: [phone]  Is this the correct pharmacy for this prescription? Yes If no, delete pharmacy and type the correct one.   Has the patient been seen for an appointment in the last year OR does the patient have an upcoming appointment? Yes

## 2024-03-04 NOTE — Telephone Encounter (Signed)
 New prescription sent.  Please call patient and just let her know that it is a different strength it is 0.5.  They only make the 2.5 in the disintegrating form.  So she will need to cut the 0.5's in half and that should be equal to the dose that she was using.  Meds ordered this encounter  Medications   clonazePAM  (KLONOPIN ) 0.5 MG tablet    Sig: Take 0.5 tablets (0.25 mg total) by mouth daily as needed for anxiety.    Dispense:  15 tablet    Refill:  0

## 2024-03-05 NOTE — Telephone Encounter (Signed)
 Patient informed of medication change and to cut the klonopin  0.5mg  tablet in half  to total 0.25mg   Patient asked to change her preferred pharmacy to [facility] pharmacy. Added this to patient chart and removed [facility] at her request as well.

## 2024-03-05 NOTE — Telephone Encounter (Signed)
 Attempted call to patient. Left a voice mail message requesting a return call.

## 2024-03-24 ENCOUNTER — Encounter: Payer: Self-pay | Admitting: Sports Medicine

## 2024-04-28 ENCOUNTER — Other Ambulatory Visit: Payer: Self-pay | Admitting: Family Medicine

## 2024-04-28 MED ORDER — CLONAZEPAM 0.5 MG PO TABS: 0.2500 mg | ORAL_TABLET | Freq: Every day | ORAL | 0 refills | Status: DC | PRN

## 2024-04-28 NOTE — Telephone Encounter (Signed)
 Last filled 03/04/2024  Last OV 11/26/2023  Upcoming appointment 05/27/2024

## 2024-04-28 NOTE — Telephone Encounter (Signed)
 Copied from CRM [id number]. Topic: Clinical - Medication Refill >> Apr 28, 2024  9:49 AM Carlatta H wrote: Medication: clonazePAM  (KLONOPIN ) 0.5 MG tablet  Has the patient contacted their pharmacy? No (Agent: If no, request that the patient contact the pharmacy for the refill. If patient does not wish to contact the pharmacy document the reason why and proceed with request.) (Agent: If yes, when and what did the pharmacy advise?)  This is the patient's preferred pharmacy:  [facility] [place], [place] - [address] [place] [address] [place] [address] Phone: [phone] Fax: [phone]  Is this the correct pharmacy for this prescription? Yes If no, delete pharmacy and type the correct one.   Has the prescription been filled recently? No  Is the patient out of the medication? Yes  Has the patient been seen for an appointment in the last year OR does the patient have an upcoming appointment? Yes  Can we respond through MyChart? Yes  Agent: Please be advised that Rx refills may take up to 3 business days. We ask that you follow-up with your pharmacy.

## 2024-05-27 ENCOUNTER — Encounter: Payer: Self-pay | Admitting: Family Medicine

## 2024-05-27 ENCOUNTER — Ambulatory Visit (INDEPENDENT_AMBULATORY_CARE_PROVIDER_SITE_OTHER): Admitting: Family Medicine

## 2024-05-27 VITALS — BP 130/66 | HR 67 | Ht 64.0 in | Wt 155.1 lb

## 2024-05-27 DIAGNOSIS — N1832 Chronic kidney disease, stage 3b: Secondary | ICD-10-CM | POA: Diagnosis not present

## 2024-05-27 DIAGNOSIS — Z23 Encounter for immunization: Secondary | ICD-10-CM

## 2024-05-27 DIAGNOSIS — R7301 Impaired fasting glucose: Secondary | ICD-10-CM

## 2024-05-27 DIAGNOSIS — T466X5A Adverse effect of antihyperlipidemic and antiarteriosclerotic drugs, initial encounter: Secondary | ICD-10-CM

## 2024-05-27 DIAGNOSIS — I10 Essential (primary) hypertension: Secondary | ICD-10-CM

## 2024-05-27 DIAGNOSIS — M791 Myalgia, unspecified site: Secondary | ICD-10-CM

## 2024-05-27 DIAGNOSIS — E785 Hyperlipidemia, unspecified: Secondary | ICD-10-CM

## 2024-05-27 DIAGNOSIS — M810 Age-related osteoporosis without current pathological fracture: Secondary | ICD-10-CM | POA: Diagnosis not present

## 2024-05-27 NOTE — Assessment & Plan Note (Signed)
 Essential hypertension Blood pressure controlled with losartan  50 mg. No recent chest pain or shortness of breath. - Continue losartan  50 mg daily.

## 2024-05-27 NOTE — Assessment & Plan Note (Signed)
 Impaired fasting glucose Requires monitoring. - Ordered A1c test.

## 2024-05-27 NOTE — Assessment & Plan Note (Signed)
  Chronic kidney disease stage 3b CKD stage 3b requires kidney function monitoring. - Ordered serum creatinine and urine microalbumin tests.

## 2024-05-27 NOTE — Assessment & Plan Note (Signed)
 Hyperlipidemia Requires monitoring. - Ordered lipid panel. - statin intolerance

## 2024-05-27 NOTE — Assessment & Plan Note (Signed)
 Myalgias with statin. Continue to monitor.

## 2024-05-27 NOTE — Assessment & Plan Note (Signed)
  Osteoporosis Fosamax  management encouraged, especially if no major dental work planned. - Start Fosamax  for osteoporosis.

## 2024-05-27 NOTE — Progress Notes (Signed)
 Established Patient Office Visit  Patient ID: Kimberly Durham, female    DOB: 03-30-1936  Age: 88 y.o. MRN: [id number] PCP: Alvan Dorothyann BIRCH, MD  Chief Complaint  Patient presents with   Hypertension   ifg    Subjective:     HPI  Discussed the use of AI scribe software for clinical note transcription with the patient, who gave verbal consent to proceed.  History of Present Illness Kimberly Durham is an 88 year old female with hypertension who presents for follow-up.  Hypertension - Blood pressure well controlled with home readings mostly in the 120s and 130s mmHg - Losartan  dose recently reduced from 100 mg to 50 mg; tolerates lower dose well and finds it easier to swallow - No chest pain or shortness of breath  Osteoporosis - Previously prescribed Fosamax  but has not initiated therapy  Right knee pain - Received intra-articular injection at [facility] on April 21, 2024, resulting in significant improvement - Occasional soreness with increased ambulation at work - Uses Voltaren  gel for symptomatic relief but developed a rash, indicating intolerance  Psychological stress - Increased stress levels due to the passing of several family members on her husband's side this year     ROS    Objective:     BP 130/66   Pulse 67   Ht 5' 4 (1.626 m)   Wt 155 lb 1.3 oz (70.3 kg)   SpO2 98%   BMI 26.62 kg/m    Physical Exam Vitals and nursing note reviewed.  Constitutional:      Appearance: Normal appearance.  HENT:     Head: Normocephalic and atraumatic.  Eyes:     Conjunctiva/sclera: Conjunctivae normal.  Cardiovascular:     Rate and Rhythm: Normal rate and regular rhythm.  Pulmonary:     Effort: Pulmonary effort is normal.     Breath sounds: Normal breath sounds.  Skin:    General: Skin is warm and dry.  Neurological:     Mental Status: She is alert.  Psychiatric:        Mood and Affect: Mood normal.      No results found for any visits on  05/27/24.    The ASCVD Risk score (Arnett DK, et al., 2019) failed to calculate for the following reasons:   The 2019 ASCVD risk score is only valid for ages 108 to 34    Assessment & Plan:   Problem List Items Addressed This Visit       Cardiovascular and Mediastinum   Hypertension - Primary   Essential hypertension Blood pressure controlled with losartan  50 mg. No recent chest pain or shortness of breath. - Continue losartan  50 mg daily.      Relevant Orders   CMP14+EGFR   Lipid panel   CBC   Hemoglobin A1c   Urine Microalbumin w/creat. ratio     Endocrine   IFG (impaired fasting glucose)   Impaired fasting glucose Requires monitoring. - Ordered A1c test.      Relevant Orders   CMP14+EGFR   Lipid panel   CBC   Hemoglobin A1c   Urine Microalbumin w/creat. ratio     Musculoskeletal and Integument   Osteoporosis    Osteoporosis Fosamax  management encouraged, especially if no major dental work planned. - Start Fosamax  for osteoporosis.      Relevant Orders   CMP14+EGFR   Lipid panel   CBC   Hemoglobin A1c   Urine Microalbumin w/creat. ratio     Genitourinary  Chronic kidney disease (CKD) stage G3b/A1, moderately decreased glomerular filtration rate (GFR) between 30-44 mL/min/1.73 square meter and albuminuria creatinine ratio less than 30 mg/g (HCC)    Chronic kidney disease stage 3b CKD stage 3b requires kidney function monitoring. - Ordered serum creatinine and urine microalbumin tests.      Relevant Orders   CMP14+EGFR   Lipid panel   CBC   Hemoglobin A1c   Urine Microalbumin w/creat. ratio     Other   Myalgia due to statin   Myalgias with statin. Continue to monitor.       Dyslipidemia   Hyperlipidemia Requires monitoring. - Ordered lipid panel. - statin intolerance      Relevant Orders   Lipid panel   Other Visit Diagnoses       Encounter for immunization       Relevant Orders   Flu vaccine HIGH DOSE PF(Fluzone Trivalent)  (Completed)     Encounter for immunization       Relevant Orders   Pfizer Comirnaty Covid-19 Vaccine 22yrs & older (Completed)       Assessment and Plan Assessment & Plan   Right knee pain, improved after injection Significant improvement post-injection with occasional soreness from excessive walking.  Voltaren  (diclofenac ) gel intolerance Rash developed after using Voltaren  gel. - Added Voltaren  gel to intolerance list.  General Health Maintenance Received flu and COVID vaccines.    Return in about 6 months (around 11/24/2024) for Hypertension, Pre-diabetes.    Dorothyann Byars, MD [facility] at [facility]

## 2024-05-28 LAB — CMP14+EGFR
ALT: 14 IU/L (ref 0–32)
AST: 21 IU/L (ref 0–40)
Albumin: 4.2 g/dL (ref 3.7–4.7)
Alkaline Phosphatase: 74 IU/L (ref 48–129)
BUN/Creatinine Ratio: 25 (ref 12–28)
BUN: 26 mg/dL (ref 8–27)
Bilirubin Total: 0.7 mg/dL (ref 0.0–1.2)
CO2: 24 mmol/L (ref 20–29)
Calcium: 10.1 mg/dL (ref 8.7–10.3)
Chloride: 98 mmol/L (ref 96–106)
Creatinine, Ser: 1.05 mg/dL — ABNORMAL HIGH (ref 0.57–1.00)
Globulin, Total: 2.8 g/dL (ref 1.5–4.5)
Glucose: 100 mg/dL — ABNORMAL HIGH (ref 70–99)
Potassium: 4.9 mmol/L (ref 3.5–5.2)
Sodium: 140 mmol/L (ref 134–144)
Total Protein: 7 g/dL (ref 6.0–8.5)
eGFR: 52 mL/min/1.73 — ABNORMAL LOW (ref >59)

## 2024-05-28 LAB — CBC
Hematocrit: 41.2 % (ref 34.0–46.6)
Hemoglobin: 13.2 g/dL (ref 11.1–15.9)
MCH: 30.7 pg (ref 26.6–33.0)
MCHC: 32 g/dL (ref 31.5–35.7)
MCV: 96 fL (ref 79–97)
Platelets: 334 x10E3/uL (ref 150–450)
RBC: 4.3 x10E6/uL (ref 3.77–5.28)
RDW: 12.7 % (ref 11.7–15.4)
WBC: 9.2 x10E3/uL (ref 3.4–10.8)

## 2024-05-28 LAB — LIPID PANEL
Chol/HDL Ratio: 2.4 ratio (ref 0.0–4.4)
Cholesterol, Total: 281 mg/dL — ABNORMAL HIGH (ref 100–199)
HDL: 117 mg/dL (ref >39)
LDL Chol Calc (NIH): 143 mg/dL — ABNORMAL HIGH (ref 0–99)
Triglycerides: 128 mg/dL (ref 0–149)
VLDL Cholesterol Cal: 21 mg/dL (ref 5–40)

## 2024-05-28 LAB — MICROALBUMIN / CREATININE URINE RATIO
Creatinine, Urine: 20.6 mg/dL
Microalb/Creat Ratio: 35 mg/g{creat} — ABNORMAL HIGH (ref 0–29)
Microalbumin, Urine: 7.3 ug/mL

## 2024-05-28 LAB — HEMOGLOBIN A1C
Est. average glucose Bld gHb Est-mCnc: 123 mg/dL
Hgb A1c MFr Bld: 5.9 % — ABNORMAL HIGH (ref 4.8–5.6)

## 2024-06-01 ENCOUNTER — Ambulatory Visit: Payer: Self-pay | Admitting: Family Medicine

## 2024-06-01 NOTE — Progress Notes (Signed)
 Hi Coretha,  Your total cholesterol and LDL are still elevated.  Triglycerides do look a little better this time.  A1c looks great at 5.9.  Just a little bit of protein in the urine but better than 6 months ago so that is great.  Kidney function is stable at 1.0.  Liver enzymes are normal.  Blood count looks good.

## 2024-07-02 ENCOUNTER — Other Ambulatory Visit: Payer: Self-pay | Admitting: Family Medicine

## 2024-11-24 ENCOUNTER — Ambulatory Visit: Admitting: Family Medicine
